# Patient Record
Sex: Male | Born: 1970
Health system: Southern US, Community
[De-identification: ages and names within clinical notes are randomized; demographics above are authoritative.]

## PROBLEM LIST (undated history)

## (undated) DIAGNOSIS — I10 Essential (primary) hypertension: Secondary | ICD-10-CM

## (undated) DIAGNOSIS — G4733 Obstructive sleep apnea (adult) (pediatric): Principal | ICD-10-CM

## (undated) DIAGNOSIS — E785 Hyperlipidemia, unspecified: Secondary | ICD-10-CM

## (undated) DIAGNOSIS — F329 Major depressive disorder, single episode, unspecified: Secondary | ICD-10-CM

## (undated) DIAGNOSIS — F32A Depression, unspecified: Secondary | ICD-10-CM

## (undated) DIAGNOSIS — E0590 Thyrotoxicosis, unspecified without thyrotoxic crisis or storm: Secondary | ICD-10-CM

## (undated) DIAGNOSIS — K219 Gastro-esophageal reflux disease without esophagitis: Secondary | ICD-10-CM

## (undated) HISTORY — DX: Depression, unspecified: F32.A

## (undated) HISTORY — DX: Obstructive sleep apnea (adult) (pediatric): G47.33

## (undated) HISTORY — DX: Essential (primary) hypertension: I10

## (undated) HISTORY — DX: Gastro-esophageal reflux disease without esophagitis: K21.9

## (undated) HISTORY — DX: Major depressive disorder, single episode, unspecified: F32.9

## (undated) HISTORY — DX: Thyrotoxicosis, unspecified without thyrotoxic crisis or storm: E05.90

## (undated) HISTORY — DX: Hyperlipidemia, unspecified: E78.5

---

## 1994-03-21 HISTORY — PX: WISDOM TOOTH EXTRACTION: SHX21

## 2000-03-31 ENCOUNTER — Ambulatory Visit (HOSPITAL_COMMUNITY): Admission: RE | Admit: 2000-03-31 | Discharge: 2000-03-31 | Payer: Self-pay | Admitting: Gastroenterology

## 2000-03-31 ENCOUNTER — Encounter (INDEPENDENT_AMBULATORY_CARE_PROVIDER_SITE_OTHER): Payer: Self-pay | Admitting: Specialist

## 2008-03-21 HISTORY — PX: FINGER SURGERY: SHX640

## 2008-07-09 ENCOUNTER — Ambulatory Visit: Payer: Self-pay | Admitting: Occupational Medicine

## 2008-07-09 ENCOUNTER — Observation Stay (HOSPITAL_COMMUNITY): Admission: EM | Admit: 2008-07-09 | Discharge: 2008-07-10 | Payer: Self-pay | Admitting: Emergency Medicine

## 2008-07-09 DIAGNOSIS — IMO0002 Reserved for concepts with insufficient information to code with codable children: Secondary | ICD-10-CM | POA: Insufficient documentation

## 2009-02-14 ENCOUNTER — Ambulatory Visit: Payer: Self-pay | Admitting: Family Medicine

## 2009-02-14 DIAGNOSIS — J029 Acute pharyngitis, unspecified: Secondary | ICD-10-CM | POA: Insufficient documentation

## 2009-02-14 DIAGNOSIS — E039 Hypothyroidism, unspecified: Secondary | ICD-10-CM | POA: Insufficient documentation

## 2009-02-14 DIAGNOSIS — F329 Major depressive disorder, single episode, unspecified: Secondary | ICD-10-CM | POA: Insufficient documentation

## 2009-02-14 DIAGNOSIS — E785 Hyperlipidemia, unspecified: Secondary | ICD-10-CM | POA: Insufficient documentation

## 2009-02-14 DIAGNOSIS — K219 Gastro-esophageal reflux disease without esophagitis: Secondary | ICD-10-CM | POA: Insufficient documentation

## 2009-02-14 LAB — CONVERTED CEMR LAB: Rapid Strep: NEGATIVE

## 2010-08-03 NOTE — Op Note (Signed)
NAME:  Shane Blackburn, Shane Blackburn NO.:  [id number]   MEDICAL RECORD NO.:  [id number]          PATIENT TYPE:  OBV   LOCATION:  5011                         FACILITY:  [facility]   PHYSICIAN:  Madelynn Done, MD  DATE OF BIRTH:  [birth year]   DATE OF PROCEDURE:  07/09/2008  DATE OF DISCHARGE:                               OPERATIVE REPORT   PREOPERATIVE DIAGNOSES:  1. Left long finger distal tip amputation.  2. Left ring finger nail plate avulsion.   POSTOPERATIVE DIAGNOSES:  1. Left long finger distal tip amputation.  2. Left ring finger nail plate avulsion.   ATTENDING SURGEON:  Madelynn Done, MD, who scrubbed and present for  the entire procedure.   ASSISTANT SURGEON:  None.   SURGICAL PROCEDURES:  1. Left long finger completion amputation through the level of distal      phalanx with local neurectomies and advancement flap closure.  2. Debridement of skin, subcutaneous tissue, and nail plate, left ring      finger.   ANESTHESIA:  General via LMA.   TOURNIQUET TIME:  30 minutes at 250 mmHg.   SURGICAL INDICATIONS:  Mr. Amirault is a 40 year old gentleman who got  his left long finger caught in a fan belt.  He separated off the distal  tip of the finger.  The patient presented to an outside urgent care abd  was transferred to [facility] for definitive treatment.  The patient was  outlined the treatment options and signed informed consent was obtained  to proceed with the above procedure.  Risks, benefits, and alternatives  were discussed in detail.  Risks include but not limited to bleeding,  infection, damage to nearby nerves, arteries, and tendons, loss of  motion in the digits, pain of the stump of the digit, and need for  further surgical intervention.   DESCRIPTION OF PROCEDURE:  The patient was properly identified in the  preoperative holding area.  A marker with a permanent marker was made on  the left long and ring fingers to indicate the correct  operative site.  The patient was then brought back to the operating room and placed  supine on the anesthesia room table where general anesthesia was  administered.  The patient tolerated this well.  A well-padded  tourniquet was then placed on the left forearm and sealed with a 1000  drape.  The left upper extremity was then prepped with Hibiclens and  then sterilely draped.  A time-out was called.  The correct side was  identified and procedure was then begun.  The patient did have an open  distal phalanx fracture to the left long finger with exposed bone and  nail bed and had volar soft tissue loss.  There was moderate amount of  the contamination in the soft tissues.  Following this, the bone was  then taken back in order to obtain for wound closure.  The surrounding  nailbed what left was it most of the nailbed had been avulsed was then  sharply removed, so a complete nail bed ablation was then completed.  There was little  nailbed remaining following the injury.  Following  this, the rongeur was then used to trim up the edges of the bone in  order to obtain for wound closure, making sure to protect the FDP  insertion.  Local neurectomies were then done.  Skin flaps were then  advanced both radially and ulnarly over the top of the distal phalanx.  This allowed for good wound closure.  The wound was then thoroughly  irrigated.  Skin flaps were then advanced and then closed with 4-0 nylon  simple sutures.  There was a good closure with a mild degree of  shortening of the digit through the amputation site through the distal  phalanx.  After completion of amputation, neurectomies, and advancement  flap closure, the wound was then irrigated.  Attention was then turned  to the ring finger where the remaining portion of the nail plate was  then removed and debridement of the skin and subcutaneous tissues as  well as nailbed was then carried out.  The Adaptic dressings were then  applied over  both digits.  Tourniquet was deflated.  Good perfusion of  the fingertips.  A 0.25% Marcaine, 15 mL was then infiltrated between  both digits.  Sterile compressive dressing was then applied.  The  patient was placed in bulky finger dressings.  He was extubated and  taken to the recovery room in good condition.   POSTOPERATIVE PLAN:  The patient will be admitted overnight for IV  antibiotics and pain control, discharge in the morning, then will be  seen back in the office in 7 days for wound check and sutures out likely  at the 2-week mark.      Madelynn Done, MD  Electronically Signed     FWO/MEDQ  D:  07/10/2008  T:  07/10/2008  Job:  [id number]

## 2010-08-06 NOTE — Procedures (Signed)
[facility]  Patient:    Shane Blackburn, Shane Blackburn                       MRN: [id number] Proc. Date: 03/31/00 Attending:  Verlin Grills, M.D. CC:         Vikki Ports, M.D.   Procedure Report  PROCEDURE:  Colonoscopy and biopsy.  ENDOSCOPIST:  Verlin Grills, M.D.  REFERRING PHYSICIAN:  Vikki Ports, M.D.  INDICATIONS FOR PROCEDURE:  The patient (date of birth, Feb 18, 1971) is a 40 year old male.  He was evaluated by Dr. Lanell Persons, January 13, 2000. The patient was experiencing intermittent postprandial abdominal cramps with nonbloody diarrhea.  He denied dysphagia, odynophagia, nausea, vomiting, or gastrointestinal bleeding.  The patient submitted three stools for Hemoccult testing; one of three stool Hemoccult cards was positive for blood.  The patient denies a personal or family history of colorectal cancer.  He takes Synthroid on a daily basis when he developed hyperthyroidism.  He was treated with radioactive iodine resulting in hypothyroidism.  The patient viewed our colonoscopy education film.  He has signed the operative permit for a diagnostic colonoscopy.  MEDICATION ALLERGIES:  None.  CHRONIC MEDICATIONS:  Synthroid for hypothyroidism.  FAMILY HISTORY:  Father is 15 and has hypertension.  Mother is 57 and has hypertension.  His 80 year old sister is in good health.  SOCIAL HISTORY:  The patients 35 year old wife is in excellent health.  His 50-year-old son and 37-year-old son are in excellent health.  PREMEDICATION:  Demerol 50 mg and Versed 10 mg.  ENDOSCOPE:  Olympus pediatric colonoscope.  DESCRIPTION OF PROCEDURE:  After obtaining informed consent, the patient was placed in the left lateral decubitus position.  I administered intravenous Demerol and intravenous Versed to achieve conscious sedation for the procedure.  The patients blood pressure, oxygen saturation, and cardiac rhythm were monitored  throughout the procedure and documented in the medical record.  Anal inspection was normal.  Digital rectal exam was normal.  The Olympus pediatric video colonoscope was introduced into the rectum and under direct vision advanced to the cecum and was identified by a normal-appearing ileocecal valve.  The ileocecal valve was intubated and the distal ileum inspected.  Colonic preparation for the exam today was excellent.  Rectum:  From the distal rectum, a 0.5 mm polyp was removed with the cold biopsy forceps and submitted for pathological interpretation.  Sigmoid colon and descending colon normal.  Splenic flexure normal.  Transverse colon normal.  Hepatic flexure normal.  Ascending colon normal.  Cecum and ileocecal valve normal.  Distal ileum normal.  Three biopsies were taken from the right colon and three biopsies were taken from the descending colon to rule out microscopic - collagenous colitis.  ASSESSMENT: 1. A 0.5 mm polyp that was removed with the cold biopsy forceps in the distal    rectum and submitted for pathological interpretation. 2. Otherwise normal proctocolonoscopy to the cecum with intubation of the    ileocecal valve and distal ileum on inspection. 3. Random biopsies taken from the right colon and left colon to rule out    lymphocytic - collagenous colitis. DD:  03/31/00 TD:  04/01/00 Job: [id number]

## 2014-02-27 ENCOUNTER — Ambulatory Visit (INDEPENDENT_AMBULATORY_CARE_PROVIDER_SITE_OTHER): Payer: BC Managed Care – PPO | Admitting: Cardiology

## 2014-02-27 ENCOUNTER — Encounter: Payer: Self-pay | Admitting: Cardiology

## 2014-02-27 VITALS — BP 136/90 | HR 78 | Ht 69.5 in | Wt 193.1 lb

## 2014-02-27 DIAGNOSIS — G4733 Obstructive sleep apnea (adult) (pediatric): Secondary | ICD-10-CM

## 2014-02-27 NOTE — Patient Instructions (Signed)
Your physician has recommended that you have a sleep study (NOT split night, regular PSG). This test records several body functions during sleep, including: brain activity, eye movement, oxygen and carbon dioxide blood levels, heart rate and rhythm, breathing rate and rhythm, the flow of air through your mouth and nose, snoring, body muscle movements, and chest and belly movement.  Your physician wants you to follow-up in: 1 year with Dr. Mayford Knifeurner. You will receive a reminder letter in the mail two months in advance. If you don't receive a letter, please call our office to schedule the follow-up appointment.

## 2014-02-27 NOTE — Progress Notes (Signed)
  [address] [place], [place]  [address] Phone: [phone] Fax:  [phone]  Date:  02/27/2014   ID:  Shane PilotColin Blackburn, DOB [birth year], MRN [id number]  PCP:  No primary care provider on file.  Cardiologist:  Shane Magicraci Grady Mohabir, MD    History of Present Illness: This is Shane 43yo male with Shane history of OSA who was intolerant to CPAP and has been using an oral device and HTN who presents today for followup of his HTN.  He says that he has been snoring some at home despite using the oral device.  He denies any chest pain, SOB, DOE, LE edema,  palpitations or syncope.  Occasionally he will have some dizziness when going from sitting to standing.  He has had some problems with sleepiness after eating lunch in the afternoon.    Wt Readings from Last 3 Encounters:  02/27/14 193 lb 1.9 oz (87.599 kg)     Past Medical History  Diagnosis Date  . Hyperthyroidism     now hypothyroidism  . GERD (gastroesophageal reflux disease)   . Depression   . Dyslipidemia   . Hypertension     Current Outpatient Prescriptions  Medication Sig Dispense Refill  . atorvastatin (LIPITOR) 40 MG tablet Take 40 mg by mouth daily.    Marland Kitchen. levothyroxine (SYNTHROID, LEVOTHROID) 137 MCG tablet Take 137 mcg by mouth daily before breakfast.    . losartan (COZAAR) 50 MG tablet Take 50 mg by mouth daily.    Marland Kitchen. omeprazole (PRILOSEC) 20 MG capsule Take 20 mg by mouth daily.     No current facility-administered medications for this visit.    Allergies:    Allergies  Allergen Reactions  . Norvasc [Amlodipine Besylate]     swelling    Social History:  The patient  reports that he has never smoked. He does not have any smokeless tobacco history on file.   Family History:  The patient's family history includes CVA in his father; Diabetes in his mother; Hypertension in his father and mother.   ROS:  Please see the history of present illness.      All other systems reviewed and negative.   PHYSICAL EXAM: VS:  BP  136/90 mmHg  Pulse 78  Ht 5' 9.5" (1.765 m)  Wt 193 lb 1.9 oz (87.599 kg)  BMI 28.12 kg/m2 Well nourished, well developed, in no acute distress HEENT: normal Neck: no JVD Cardiac:  normal S1, S2; RRR; no murmur Lungs:  clear to auscultation bilaterally, no wheezing, rhonchi or rales Abd: soft, nontender, no hepatomegaly Ext: no edema Skin: warm and dry Neuro:  CNs 2-12 intact, no focal abnormalities noted   ASSESSMENT AND PLAN:  1. OSA intolerant to CPAP and doing well with oral device but he has been having more problems with snoring especially when sleeping on his back. He does get sleepy in the afternoon after lunch.   I will repeat Shane PSG with the oral device to see if his OSA is adequately treated.  2. HTN borderline controlled.  He will continue on Losartan.  He will check his BP next week at work and call with results  Followup with me in 1 year  Signed, Shane Magicraci Lorelle Macaluso, MD [facility] 02/27/2014 11:16 AM

## 2014-03-10 ENCOUNTER — Telehealth: Payer: Self-pay | Admitting: Cardiology

## 2014-03-10 NOTE — Telephone Encounter (Signed)
New message     Bp readings Dec 14th   133/81, 16th  135/85;  18th 136/80

## 2014-03-10 NOTE — Telephone Encounter (Signed)
To Dr. Turner for review. 

## 2014-05-18 ENCOUNTER — Ambulatory Visit (HOSPITAL_BASED_OUTPATIENT_CLINIC_OR_DEPARTMENT_OTHER): Payer: BC Managed Care – PPO | Attending: Cardiology

## 2014-05-18 DIAGNOSIS — G4733 Obstructive sleep apnea (adult) (pediatric): Secondary | ICD-10-CM

## 2014-05-26 ENCOUNTER — Telehealth: Payer: Self-pay | Admitting: Cardiology

## 2014-05-26 NOTE — Sleep Study (Signed)
   NAME: Shane PilotColin Pillay DATE OF BIRTH:  [birth year] MEDICAL RECORD NUMBER [id number]  LOCATION: [facility]  PHYSICIAN: Bhavya Eschete R  DATE OF STUDY: 05/18/2014  SLEEP STUDY TYPE: Nocturnal Polysomnogram               REFERRING PHYSICIAN: Quintella Reicherturner, Brennen Gardiner R, MD  INDICATION FOR STUDY: Obstructive sleep apnea currently using an oral device but now having more snoring.  Study done with patient wearing his oral device.    EPWORTH SLEEPINESS SCORE: 2 HEIGHT: 5\' 9"  (175.3 cm)  WEIGHT: 192 lb (87.091 kg)    Body mass index is 28.34 kg/(m^2).  NECK SIZE: 17 in.  MEDICATIONS: Reviewed in the chart  SLEEP ARCHITECTURE: The patient slept for a total of 323 minutes out of a total sleep period of 379 minutes.  There was 1.5 minutes of slow wave sleep and 46 minutes of REM sleep.  The onset to sleep latency was 5 minutes and onset to REM sleep latency was short at 61 minutes.  The sleep efficiency was normal at 85%.  There was mild snoring.  There were increased arousals which were primarily spontaneous arousals.  RESPIRATORY DATA: The patient had 2 central apneas and 14 hypopneas noted during sleep.  Most occurred during REM sleep in the supine position.  The overall AHI was normal at 3 events per hour.   OXYGEN DATA: The lowest oxygen saturation noted was 90%.    CARDIAC DATA: The patient maintained NSR with sinus arrhythmia and PAC's.  The average heart rate during sleep was 68 bpm.  The lowest heart rate was 35 bpm and the highest heart rate was 187 bpm.    MOVEMENT/PARASOMNIA: There were no periodic limb movement disorders or REM sleep behavior disorders noted.    IMPRESSION/ RECOMMENDATION:   1.  No significant sleep apnea/hypopnea syndrome during sleep while wearing oral device. 2.  No significant oxygen desaturations were noted. 3.  There were occasional PVC's noted. NSR was present for study with average heart rate of 68 bpm. 4.  Mild snoring was noted. 5.  No  periodic limb movements were noted. 6.  Normal sleep efficiency overall with increased number of spontaneous arousals from sleep. 7.  Could consider ENT referral to evaluate for surgical causes for continued snoring. 8.  The patient should be counseled on good sleep hygiene. 9.  The patient should be counseled on avoiding sleeping in the supine position.   Signed: Quintella ReichertURNER,Kasch Borquez R Diplomate, American Board of Sleep Medicine  ELECTRONICALLY SIGNED ON:  05/26/2014, 10:24 AM [facility] PH: [phone]   FX: [phone] ACCREDITED BY THE AMERICAN ACADEMY OF SLEEP MEDICINE

## 2014-05-26 NOTE — Telephone Encounter (Signed)
Please let patient know that his sleep apnea is very well treated with the oral device so no changes need to be made.  He does snore some.  I would recommend that he avoid sleeping supine.  Please find out if patient would like referral to ENT to evaluate for surgical causes of continued snoring.  I would recommend that he use nasal saline spray BID to help reduce any inflammation in nasal passages.

## 2014-05-27 NOTE — Telephone Encounter (Signed)
Patient is aware that sleep apnea is well treated so there will be no changes made. I also let him know to try to avoid sleeping on his back, and that Dr. Mayford Knifeurner said she would recommend the nasal saline spray twice a day for inflammation. He stated at this time he is not interested in a referral to the ENT, but if he changes his mind he will let us know.

## 2014-05-27 NOTE — Telephone Encounter (Signed)
Left message to call concerning sleep study

## 2014-11-04 ENCOUNTER — Telehealth: Payer: Self-pay | Admitting: Cardiology

## 2014-11-04 NOTE — Telephone Encounter (Signed)
Walk in pt form-Dept of Transportation-paper dropped off Katy back Monday (8/22) will give to her then.

## 2014-12-03 ENCOUNTER — Encounter: Payer: Self-pay | Admitting: Cardiology

## 2015-01-21 ENCOUNTER — Encounter: Payer: Self-pay | Admitting: Cardiology

## 2015-03-03 ENCOUNTER — Encounter: Payer: Self-pay | Admitting: Cardiology

## 2015-03-03 ENCOUNTER — Ambulatory Visit (INDEPENDENT_AMBULATORY_CARE_PROVIDER_SITE_OTHER): Payer: BLUE CROSS/BLUE SHIELD | Admitting: Cardiology

## 2015-03-03 VITALS — BP 122/84 | HR 60 | Ht 69.0 in | Wt 156.0 lb

## 2015-03-03 DIAGNOSIS — G4733 Obstructive sleep apnea (adult) (pediatric): Secondary | ICD-10-CM

## 2015-03-03 DIAGNOSIS — I10 Essential (primary) hypertension: Secondary | ICD-10-CM | POA: Diagnosis not present

## 2015-03-03 HISTORY — DX: Obstructive sleep apnea (adult) (pediatric): G47.33

## 2015-03-03 HISTORY — DX: Essential (primary) hypertension: I10

## 2015-03-03 NOTE — Progress Notes (Signed)
Cardiology Office Note   Date:  03/03/2015   ID:  Shane Blackburn, DOB [birth year], MRN [id number]  PCP:  No primary care provider on file.    Chief Complaint  Patient presents with  . Follow-up    OSA      History of Present Illness: This is a 44yo male with a history of OSA who was intolerant to CPAP and has been using an oral device and HTN who presents today for followup of his HTN. He says that he has minimal snoring when using the oral device. He denies any chest pain, SOB, DOE, LE edema, palpitations or syncope.He feels rested in the am and has no daytime sleepiness.  He has lost 42 lbs since I saw him last. He would like to have another sleep study to see if he still needs to wear the oral device since losing weight.  Past Medical History  Diagnosis Date  . Hyperthyroidism     now hypothyroidism  . GERD (gastroesophageal reflux disease)   . Depression   . Dyslipidemia   . Hypertension   . OSA (obstructive sleep apnea) 03/03/2015  . Benign essential HTN 03/03/2015    Past Surgical History  Procedure Laterality Date  . Finger surgery  2010    finger tip  . Wisdom tooth extraction  1996     Current Outpatient Prescriptions  Medication Sig Dispense Refill  . levothyroxine (SYNTHROID, LEVOTHROID) 137 MCG tablet Take 137 mcg by mouth daily before breakfast.     No current facility-administered medications for this visit.    Allergies:   Norvasc    Social History:  The patient  reports that he has never smoked. He does not have any smokeless tobacco history on file.   Family History:  The patient's family history includes CVA in his father; Diabetes in his mother; Hypertension in his father and mother.    ROS:  Please see the history of present illness.   Otherwise, review of systems are positive for none.   All other systems are reviewed and negative.    PHYSICAL EXAM: VS:  BP 122/84 mmHg  Pulse 60  Ht 5\' 9"  (1.753 m)  Wt 156 lb  (70.761 kg)  BMI 23.03 kg/m2  SpO2 98% , BMI Body mass index is 23.03 kg/(m^2). GEN: Well nourished, well developed, in no acute distress HEENT: normal Neck: no JVD, carotid bruits, or masses Cardiac: RRR; no murmurs, rubs, or gallops,no edema  Respiratory:  clear to auscultation bilaterally, normal work of breathing GI: soft, nontender, nondistended, + BS MS: no deformity or atrophy Skin: warm and dry, no rash Neuro:  Strength and sensation are intact Psych: euthymic mood, full affect   EKG:  EKG is not ordered today.   Recent Labs: No results found for requested labs within last 365 days.    Lipid Panel No results found for: CHOL, TRIG, HDL, CHOLHDL, VLDL, LDLCALC, LDLDIRECT    Wt Readings from Last 3 Encounters:  03/03/15 156 lb (70.761 kg)  05/18/14 192 lb (87.091 kg)  02/27/14 193 lb 1.9 oz (87.599 kg)     ASSESSMENT AND PLAN:  1. OSA intolerant to CPAP and doing well with oral device with last PSG with the oral device with no evidence of OSA.  He has lost 42 lbs and would like to see if he still needs the oral device.  I will order a home sleep  study and have instructed him not to wear his device the nights he does the study.  If no significant OSA then can stop the oral device. 2. HTN controlled.    Current medicines are reviewed at length with the patient today.  The patient does not have concerns regarding medicines.  The following changes have been made:  no change  Labs/ tests ordered today: See above Assessment and Plan No orders of the defined types were placed in this encounter.     Disposition:   FU with me in 1 year  Signed, Quintella Reichert, MD  03/03/2015 8:57 AM    [facility] [address] [place], [place], [place]  [address] Phone: [phone]; Fax: [phone]

## 2015-03-03 NOTE — Patient Instructions (Signed)
Medication Instructions:  Your physician recommends that you continue on your current medications as directed. Please refer to the Current Medication list given to you today.   Labwork: None  Testing/Procedures: Dr. Mayford Knifeurner recommends you have a HOME SLEEP STUDY. Our office CPAP assistant will be in touch with you soon to get this scheduled. Her name is Toma CopierBethany, [place], and her direct line is [phone].  Follow-Up: Your physician wants you to follow-up in: 1 year with Dr. Mayford Knifeurner. You will receive a reminder letter in the mail two months in advance. If you don't receive a letter, please call our office to schedule the follow-up appointment.   Any Other Special Instructions Will Be Listed Below (If Applicable).     If you need a refill on your cardiac medications before your next appointment, please call your pharmacy.

## 2015-04-08 ENCOUNTER — Telehealth: Payer: Self-pay | Admitting: Cardiology

## 2015-04-08 DIAGNOSIS — G4733 Obstructive sleep apnea (adult) (pediatric): Secondary | ICD-10-CM

## 2015-04-08 NOTE — Telephone Encounter (Signed)
Left message for patient that Toma Copier will call tomorrow to initiate scheduling process for sleep study.

## 2015-04-08 NOTE — Telephone Encounter (Signed)
New Message  Pt calling for rn to schedule home study for sleep

## 2015-04-09 NOTE — Telephone Encounter (Signed)
OK to proceed with in lab sleep study

## 2015-04-09 NOTE — Addendum Note (Signed)
Addended by: Arcola Jansky on: 04/09/2015 10:45 AM   Modules accepted: Orders

## 2015-04-09 NOTE — Telephone Encounter (Signed)
Patient does still have [employer] blue shield, so he does not qualify for a home sleep study because his BMI is not 35 or greater.   Patient is qualified for a lab sleep study, and says he will do that if he needs to.  I will route to Dr. Mayford Knife for approval, then I will order a lab study and get him scheduled.  Patient is aware that I will call him back with more information.

## 2015-04-09 NOTE — Telephone Encounter (Signed)
I have left a message for patient to call me to verify his insurance. If he is still with [employer], they will not cover a home sleep study for him because he does not meet all the requirements.  Once I know for sure if he has the same insurance, I will route to Turner to discuss.

## 2015-04-14 ENCOUNTER — Other Ambulatory Visit: Payer: Self-pay | Admitting: *Deleted

## 2015-04-14 DIAGNOSIS — G4733 Obstructive sleep apnea (adult) (pediatric): Secondary | ICD-10-CM

## 2015-05-27 ENCOUNTER — Ambulatory Visit (HOSPITAL_BASED_OUTPATIENT_CLINIC_OR_DEPARTMENT_OTHER): Payer: BLUE CROSS/BLUE SHIELD | Attending: Cardiology | Admitting: *Deleted

## 2015-05-27 DIAGNOSIS — G4733 Obstructive sleep apnea (adult) (pediatric): Secondary | ICD-10-CM | POA: Insufficient documentation

## 2015-05-31 ENCOUNTER — Telehealth: Payer: Self-pay | Admitting: Cardiology

## 2015-05-31 NOTE — Sleep Study (Signed)
   Patient Name: Shane Blackburn, Shane Blackburn MRN: [id number] Study Date: 05/27/2015 Gender: Male D.O.B: 08/08/1970 Age (years): [age] Referring Provider: Armanda Magicraci Turner MD, ABSM Interpreting Physician: Armanda Magicraci Turner MD, ABSM RPSGT: Neeriemer, Holly  Weight (lbs): 151 Height (inches): 69 BMI: 22 Neck Size: 15.25  CLINICAL INFORMATION Sleep Study Type: NPSG Indication for sleep study: OSA Epworth Sleepiness Score: 6  SLEEP STUDY TECHNIQUE As per the AASM Manual for the Scoring of Sleep and Associated Events v2.3 (April 2016) with a hypopnea requiring 4% desaturations. The channels recorded and monitored were frontal, central and occipital EEG, electrooculogram (EOG), submentalis EMG (chin), nasal and oral airflow, thoracic and abdominal wall motion, anterior tibialis EMG, snore microphone, electrocardiogram, and pulse oximetry.  MEDICATIONS Patient's medications include: Synthroid.  Medications self-administered by patient during sleep study : No sleep medicine administered.  SLEEP ARCHITECTURE The study was initiated at 10:02:44 PM and ended at 4:37:11 AM. Sleep onset time was 20.9 minutes and the sleep efficiency was reduced 74.0%. The total sleep time was 292.0 minutes. Stage REM latency was 104.5 minutes. The patient spent 7.53% of the night in stage N1 sleep, 81.34% in stage N2 sleep, 5.65% in stage N3 and 5.48% in REM. Alpha intrusion was absent. Supine sleep was 67.22%.  RESPIRATORY PARAMETERS The overall apnea/hypopnea index (AHI) was 0.2 per hour. There were 1 total apneas, including 0 obstructive, 1 central and 0 mixed apneas. There were 0 hypopneas and 1 RERAs. The AHI during Stage REM sleep was 0.0 per hour. AHI while supine was 0.3 per hour. The mean oxygen saturation was 95.61%. The minimum SpO2 during sleep was 93.00%. snoring was noted during this study.  CARDIAC DATA The 2 lead EKG demonstrated sinus rhythm. The mean heart rate was 61.69 beats per minute. Other EKG  findings include: None.  LEG MOVEMENT DATA The total PLMS were 1 with a resulting PLMS index of 0.21. Associated arousal with leg movement index was 0.0 .  IMPRESSIONS - No significant obstructive sleep apnea occurred during this study (AHI = 0.2/h). - No significant central sleep apnea occurred during this study (CAI = 0.2/h). - The patient had minimal or no oxygen desaturation during the study (Min O2 = 93.00%) - No snoring was audible during this study. - No cardiac abnormalities were noted during this study. - Clinically significant periodic limb movements did not occur during sleep. No significant associated arousals.  RECOMMENDATIONS - Avoid alcohol, sedatives and other CNS depressants that may worsen sleep apnea and disrupt normal sleep architecture. - Sleep hygiene should be reviewed to assess factors that may improve sleep quality. - Weight management and regular exercise should be initiated or continued if appropriate.  Quintella ReichertURNER,TRACI R Diplomate, American Board of Sleep Medicine  ELECTRONICALLY SIGNED ON:  05/31/2015, 5:54 PM [facility] PH: [phone]   FX: [phone] ACCREDITED BY THE AMERICAN ACADEMY OF SLEEP MEDICINE

## 2015-05-31 NOTE — Telephone Encounter (Signed)
Please let patient know that sleep study showed no significant sleep apnea.    

## 2015-06-01 NOTE — Telephone Encounter (Signed)
Shane Blackburn recentll had a sleep study which showed that he no longer has Obstructive Sleep Apnea and no longer needs to use his oral device at night.

## 2015-06-01 NOTE — Telephone Encounter (Signed)
Patient has been informed of information.  Stated verbal understanding.  Patient is wanting to see if there is something he can get from Dr. Mayford Knifeurner stating that he is now without apnea and no longer needs his his oral device.

## 2015-06-01 NOTE — Telephone Encounter (Signed)
Left message for patient to call back  

## 2015-06-02 NOTE — Telephone Encounter (Signed)
Information has been mailed to patient per his request.

## 2015-06-09 ENCOUNTER — Ambulatory Visit (HOSPITAL_BASED_OUTPATIENT_CLINIC_OR_DEPARTMENT_OTHER): Payer: BLUE CROSS/BLUE SHIELD

## 2015-10-09 DIAGNOSIS — N3943 Post-void dribbling: Secondary | ICD-10-CM | POA: Diagnosis not present

## 2016-04-28 DIAGNOSIS — E890 Postprocedural hypothyroidism: Secondary | ICD-10-CM | POA: Diagnosis not present

## 2016-04-28 DIAGNOSIS — Z0000 Encounter for general adult medical examination without abnormal findings: Secondary | ICD-10-CM | POA: Diagnosis not present

## 2016-05-03 DIAGNOSIS — M545 Low back pain: Secondary | ICD-10-CM | POA: Diagnosis not present

## 2016-05-03 DIAGNOSIS — M25559 Pain in unspecified hip: Secondary | ICD-10-CM | POA: Diagnosis not present

## 2016-05-03 DIAGNOSIS — M5432 Sciatica, left side: Secondary | ICD-10-CM | POA: Diagnosis not present

## 2016-05-03 DIAGNOSIS — M533 Sacrococcygeal disorders, not elsewhere classified: Secondary | ICD-10-CM | POA: Diagnosis not present

## 2016-05-04 DIAGNOSIS — Z23 Encounter for immunization: Secondary | ICD-10-CM | POA: Diagnosis not present

## 2016-05-04 DIAGNOSIS — Z0000 Encounter for general adult medical examination without abnormal findings: Secondary | ICD-10-CM | POA: Diagnosis not present

## 2016-05-06 DIAGNOSIS — M533 Sacrococcygeal disorders, not elsewhere classified: Secondary | ICD-10-CM | POA: Diagnosis not present

## 2016-05-06 DIAGNOSIS — M545 Low back pain: Secondary | ICD-10-CM | POA: Diagnosis not present

## 2016-05-06 DIAGNOSIS — M25559 Pain in unspecified hip: Secondary | ICD-10-CM | POA: Diagnosis not present

## 2016-05-10 DIAGNOSIS — M533 Sacrococcygeal disorders, not elsewhere classified: Secondary | ICD-10-CM | POA: Diagnosis not present

## 2016-05-10 DIAGNOSIS — M545 Low back pain: Secondary | ICD-10-CM | POA: Diagnosis not present

## 2016-05-10 DIAGNOSIS — M25559 Pain in unspecified hip: Secondary | ICD-10-CM | POA: Diagnosis not present

## 2016-05-13 DIAGNOSIS — M25559 Pain in unspecified hip: Secondary | ICD-10-CM | POA: Diagnosis not present

## 2016-05-13 DIAGNOSIS — M545 Low back pain: Secondary | ICD-10-CM | POA: Diagnosis not present

## 2016-05-13 DIAGNOSIS — M533 Sacrococcygeal disorders, not elsewhere classified: Secondary | ICD-10-CM | POA: Diagnosis not present

## 2016-05-18 DIAGNOSIS — M533 Sacrococcygeal disorders, not elsewhere classified: Secondary | ICD-10-CM | POA: Diagnosis not present

## 2016-05-18 DIAGNOSIS — M545 Low back pain: Secondary | ICD-10-CM | POA: Diagnosis not present

## 2016-05-18 DIAGNOSIS — M25559 Pain in unspecified hip: Secondary | ICD-10-CM | POA: Diagnosis not present

## 2016-10-24 DIAGNOSIS — M545 Low back pain: Secondary | ICD-10-CM | POA: Diagnosis not present

## 2016-10-24 DIAGNOSIS — M533 Sacrococcygeal disorders, not elsewhere classified: Secondary | ICD-10-CM | POA: Diagnosis not present

## 2016-10-24 DIAGNOSIS — M5432 Sciatica, left side: Secondary | ICD-10-CM | POA: Diagnosis not present

## 2016-10-24 DIAGNOSIS — M25559 Pain in unspecified hip: Secondary | ICD-10-CM | POA: Diagnosis not present

## 2016-10-25 DIAGNOSIS — M533 Sacrococcygeal disorders, not elsewhere classified: Secondary | ICD-10-CM | POA: Diagnosis not present

## 2016-10-25 DIAGNOSIS — M25559 Pain in unspecified hip: Secondary | ICD-10-CM | POA: Diagnosis not present

## 2016-10-25 DIAGNOSIS — M545 Low back pain: Secondary | ICD-10-CM | POA: Diagnosis not present

## 2016-10-28 DIAGNOSIS — M25559 Pain in unspecified hip: Secondary | ICD-10-CM | POA: Diagnosis not present

## 2016-10-28 DIAGNOSIS — M545 Low back pain: Secondary | ICD-10-CM | POA: Diagnosis not present

## 2016-10-28 DIAGNOSIS — M533 Sacrococcygeal disorders, not elsewhere classified: Secondary | ICD-10-CM | POA: Diagnosis not present

## 2016-12-21 ENCOUNTER — Emergency Department
Admission: EM | Admit: 2016-12-21 | Discharge: 2016-12-21 | Disposition: A | Discharge status: Home / Self Care | Payer: BLUE CROSS/BLUE SHIELD | Source: Home / Self Care | Attending: Emergency Medicine | Admitting: Emergency Medicine

## 2016-12-21 ENCOUNTER — Encounter: Payer: Self-pay | Admitting: *Deleted

## 2016-12-21 DIAGNOSIS — S56521A Laceration of other extensor muscle, fascia and tendon at forearm level, right arm, initial encounter: Secondary | ICD-10-CM

## 2016-12-21 DIAGNOSIS — S51811A Laceration without foreign body of right forearm, initial encounter: Secondary | ICD-10-CM

## 2016-12-21 DIAGNOSIS — S59911A Unspecified injury of right forearm, initial encounter: Secondary | ICD-10-CM | POA: Diagnosis not present

## 2016-12-21 NOTE — Discharge Instructions (Signed)
Please keep your appointment at 2:00 with Dr. Orland Mustard

## 2016-12-21 NOTE — ED Provider Notes (Signed)
[facility]    CSN: [id number] Arrival date & time: 12/21/16  1141     History   Chief Complaint Chief Complaint  Patient presents with  . Laceration    HPI Shane Blackburn is a 46 y.o. male.  Patient was in his usual state of health until approximately 2 hours ago when while carrying trash a piece of sharp metal lacerated his right arm. He enters here with a laceration ulnar side of the right wrist. He does not complain of any weakness or numbness in the fifth finger. HPI  Past Medical History:  Diagnosis Date  . Benign essential HTN 03/03/2015  . Depression   . Dyslipidemia   . GERD (gastroesophageal reflux disease)   . Hypertension   . Hyperthyroidism    now hypothyroidism  . OSA (obstructive sleep apnea) 03/03/2015    Patient Active Problem List   Diagnosis Date Noted  . OSA (obstructive sleep apnea) 03/03/2015  . Benign essential HTN 03/03/2015  . HYPOTHYROIDISM 02/14/2009  . HYPERLIPIDEMIA 02/14/2009  . DEPRESSION 02/14/2009  . ACUTE PHARYNGITIS 02/14/2009  . GERD 02/14/2009  . 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 FINGER SURGERY  2010   finger tip  . WISDOM TOOTH EXTRACTION  1996       Home Medications    Prior to Admission medications   Medication Sig Start Date End Date Taking? Authorizing Provider  levothyroxine (SYNTHROID, LEVOTHROID) 137 MCG tablet Take 112 mcg by mouth daily before breakfast.     [provider]    Family History Family History  Problem Relation Age of Onset  . Hypertension Mother   . Diabetes Mother   . Hypertension Father   . CVA Father     Social History Social History  Substance Use Topics  . Smoking status: Never Smoker  . Smokeless tobacco: Never Used  . Alcohol use Not on file     Allergies   Norvasc [amlodipine besylate]   Review of Systems Review of Systems  Constitutional: Negative.   Respiratory:  Negative.   Cardiovascular: Negative.   Gastrointestinal: Negative.      Physical Exam Triage Vital Signs ED Triage Vitals  Enc Vitals Group     BP 12/21/16 1158 (!) 145/91     Pulse Rate 12/21/16 1158 77     Resp 12/21/16 1158 16     Temp 12/21/16 1158 (!) 97.5 F (36.4 C)     Temp Source 12/21/16 1158 Oral     SpO2 12/21/16 1158 97 %     Weight --      Height --      Head Circumference --      Peak Flow --      Pain Score 12/21/16 1159 4     Pain Loc --      Pain Edu? --      Excl. in GC? --    No data found.   Updated Vital Signs BP (!) 145/91 (BP Location: Left Arm)   Pulse 77   Temp (!) 97.5 F (36.4 C) (Oral)   Resp 16   SpO2 97%   Visual Acuity Right Eye Distance:   Left Eye Distance:   Bilateral Distance:    Right Eye Near:   Left Eye Near:    Bilateral Near:     Physical Exam  Musculoskeletal: Normal range of motion. He exhibits tenderness. He exhibits no edema or deformity.  Skin:  There is a 2 cm flap-like laceration on the ulnar side of the right forearm 1-1/2 inches proximal to the distal ulnar styloid.with evaluation of the laceration and flexion of the wrist a laceration through the tendon to the extensor carpi ulnar skin is visible. The wound was therefore not approximated.     UC Treatments / Results  Labs (all labs ordered are listed, but only abnormal results are displayed) Labs Reviewed - No data to display  EKG  EKG Interpretation None       Radiology No results found.  Procedures Procedures (including critical care time)  Medications Ordered in UC Medications - No data to display   Initial Impression / Assessment and Plan / UC Course  I have reviewed the triage vital signs and the nursing notes.  Pertinent labs & imaging results that were available during my care of the patient were reviewed by me and considered in my medical decision making (see chart for details). This patient has a flap-like laceration with  laceration through the extensor carpi ulnaris. He has been referred to the hand orthopedist in [facility].Dr. Saul Fordyce      Final Clinical Impressions(s) / UC Diagnoses   Final diagnoses:  Laceration of extensor tendon of right forearm, initial encounter  Laceration of right forearm, initial encounter    New Prescriptions New Prescriptions   No medications on file     Controlled Substance Prescriptions [place] Controlled Substance Registry consulted? Not Applicable   Collene Gobble, MD 12/22/16 [phone]

## 2016-12-21 NOTE — ED Triage Notes (Signed)
Pt c/o RT wrist laceration x 1130 today. He was taking out the trash when trash can fell over and a piece of sheet metal in the trash cut him. Tdap 2014.

## 2016-12-23 ENCOUNTER — Telehealth: Payer: Self-pay | Admitting: *Deleted

## 2016-12-23 NOTE — Telephone Encounter (Signed)
Called to check pt's status LM to call back if he has any questions or concerns.  

## 2017-01-04 DIAGNOSIS — S59911A Unspecified injury of right forearm, initial encounter: Secondary | ICD-10-CM | POA: Diagnosis not present

## 2017-01-13 DIAGNOSIS — R6884 Jaw pain: Secondary | ICD-10-CM | POA: Diagnosis not present

## 2017-03-03 DIAGNOSIS — M545 Low back pain: Secondary | ICD-10-CM | POA: Diagnosis not present

## 2017-03-03 DIAGNOSIS — M533 Sacrococcygeal disorders, not elsewhere classified: Secondary | ICD-10-CM | POA: Diagnosis not present

## 2017-03-03 DIAGNOSIS — M25559 Pain in unspecified hip: Secondary | ICD-10-CM | POA: Diagnosis not present

## 2017-03-05 DIAGNOSIS — R51 Headache: Secondary | ICD-10-CM | POA: Diagnosis not present

## 2017-03-27 DIAGNOSIS — M545 Low back pain: Secondary | ICD-10-CM | POA: Diagnosis not present

## 2017-03-27 DIAGNOSIS — M25559 Pain in unspecified hip: Secondary | ICD-10-CM | POA: Diagnosis not present

## 2017-03-27 DIAGNOSIS — M533 Sacrococcygeal disorders, not elsewhere classified: Secondary | ICD-10-CM | POA: Diagnosis not present

## 2017-03-29 DIAGNOSIS — M533 Sacrococcygeal disorders, not elsewhere classified: Secondary | ICD-10-CM | POA: Diagnosis not present

## 2017-03-29 DIAGNOSIS — M25559 Pain in unspecified hip: Secondary | ICD-10-CM | POA: Diagnosis not present

## 2017-03-29 DIAGNOSIS — M545 Low back pain: Secondary | ICD-10-CM | POA: Diagnosis not present

## 2017-03-31 DIAGNOSIS — M533 Sacrococcygeal disorders, not elsewhere classified: Secondary | ICD-10-CM | POA: Diagnosis not present

## 2017-03-31 DIAGNOSIS — M25559 Pain in unspecified hip: Secondary | ICD-10-CM | POA: Diagnosis not present

## 2017-03-31 DIAGNOSIS — M545 Low back pain: Secondary | ICD-10-CM | POA: Diagnosis not present

## 2017-04-01 ENCOUNTER — Encounter: Payer: Self-pay | Admitting: Emergency Medicine

## 2017-04-01 ENCOUNTER — Other Ambulatory Visit: Payer: Self-pay

## 2017-04-01 ENCOUNTER — Emergency Department (INDEPENDENT_AMBULATORY_CARE_PROVIDER_SITE_OTHER): Payer: BLUE CROSS/BLUE SHIELD

## 2017-04-01 ENCOUNTER — Emergency Department
Admission: EM | Admit: 2017-04-01 | Discharge: 2017-04-01 | Disposition: A | Discharge status: Home / Self Care | Payer: BLUE CROSS/BLUE SHIELD | Source: Home / Self Care | Attending: Family Medicine | Admitting: Family Medicine

## 2017-04-01 DIAGNOSIS — G8929 Other chronic pain: Secondary | ICD-10-CM | POA: Diagnosis not present

## 2017-04-01 DIAGNOSIS — M5441 Lumbago with sciatica, right side: Secondary | ICD-10-CM | POA: Diagnosis not present

## 2017-04-01 DIAGNOSIS — M545 Low back pain, unspecified: Secondary | ICD-10-CM

## 2017-04-01 MED ORDER — CYCLOBENZAPRINE HCL 10 MG PO TABS: 10.0000 mg | ORAL_TABLET | Freq: Two times a day (BID) | ORAL | 0 refills | Status: DC | PRN

## 2017-04-01 MED ORDER — METHYLPREDNISOLONE ACETATE 80 MG/ML IJ SUSP
80.0000 mg | Freq: Once | INTRAMUSCULAR | Status: AC
  Administered 2017-04-01: 80 mg via INTRAMUSCULAR

## 2017-04-01 MED ORDER — PREDNISONE 20 MG PO TABS: ORAL_TABLET | ORAL | 0 refills | Status: DC

## 2017-04-01 MED ORDER — MELOXICAM 15 MG PO TABS: 15.0000 mg | ORAL_TABLET | Freq: Every day | ORAL | 0 refills | Status: DC

## 2017-04-01 MED ORDER — TRAMADOL HCL 50 MG PO TABS: 50.0000 mg | ORAL_TABLET | Freq: Four times a day (QID) | ORAL | 0 refills | Status: DC | PRN

## 2017-04-01 NOTE — ED Provider Notes (Signed)
[facility]    CSN: [id number] Arrival date & time: 04/01/17  0941     History   Chief Complaint Chief Complaint  Patient presents with  . Back Pain    HPI Shane Blackburn is a 47 y.o. male.   HPI  Shane PilotColin Hardgrave is a 47 y.o. male presenting to UC with c/o exacerbation of chronic low back pain.  Pain flared up this morning when attempting to put his shoes on.  He went to his chiropractor who attempted an adjustment w/o success. Pain is aching and sore, sharp with certain movements, 5/10. He was given a back brace and walker, which do help with the pain but pt is concerned as pain is radiating into Right thigh and is never usually this severe. Denies change in bowel or bladder habits. No specific injury he can recall. He took Naproxen at 730AM w/o relief.  Denies hx of back surgeries.    Past Medical History:  Diagnosis Date  . Benign essential HTN 03/03/2015  . Depression   . Dyslipidemia   . GERD (gastroesophageal reflux disease)   . Hypertension   . Hyperthyroidism    now hypothyroidism  . OSA (obstructive sleep apnea) 03/03/2015    Patient Active Problem List   Diagnosis Date Noted  . OSA (obstructive sleep apnea) 03/03/2015  . Benign essential HTN 03/03/2015  . HYPOTHYROIDISM 02/14/2009  . HYPERLIPIDEMIA 02/14/2009  . DEPRESSION 02/14/2009  . ACUTE PHARYNGITIS 02/14/2009  . GERD 02/14/2009  . 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 FINGER SURGERY  2010   finger tip  . WISDOM TOOTH EXTRACTION  1996       Home Medications    Prior to Admission medications   Medication Sig Start Date End Date Taking? Authorizing Provider  cyclobenzaprine (FLEXERIL) 10 MG tablet Take 1 tablet (10 mg total) by mouth 2 (two) times daily as needed. 04/01/17   Lurene ShadowPhelps, Amyria Komar O, PA-C  levothyroxine (SYNTHROID, LEVOTHROID) 137 MCG tablet Take 112 mcg by mouth daily before breakfast.      [provider]  meloxicam (MOBIC) 15 MG tablet Take 1 tablet (15 mg total) by mouth daily. Take once daily for 5 days, then daily as needed for pain 04/01/17   Lurene ShadowPhelps, Heiress Williamson O, PA-C  predniSONE (DELTASONE) 20 MG tablet 3 tabs po day one, then 2 po daily x 4 days 04/01/17   Lurene ShadowPhelps, Kashari Chalmers O, PA-C  traMADol (ULTRAM) 50 MG tablet Take 1 tablet (50 mg total) by mouth every 6 (six) hours as needed. 04/01/17   Lurene ShadowPhelps, Jaylan Duggar O, PA-C    Family History Family History  Problem Relation Age of Onset  . Hypertension Mother   . Diabetes Mother   . Hypertension Father   . CVA Father     Social History Social History   Tobacco Use  . Smoking status: Never Smoker  . Smokeless tobacco: Never Used  Substance Use Topics  . Alcohol use: Not on file  . Drug use: No     Allergies   Norvasc [amlodipine besylate]   Review of Systems Review of Systems  Gastrointestinal: Negative for nausea and vomiting.  Genitourinary: Negative for dysuria, flank pain, frequency and hematuria.  Musculoskeletal: Positive for arthralgias (Right hip), back pain ( Right lower) and myalgias. Negative for neck pain and neck stiffness.  Neurological: Negative for weakness and numbness.     Physical Exam Triage Vital Signs ED Triage  Vitals  Enc Vitals Group     BP 04/01/17 1002 (!) 165/81     Pulse Rate 04/01/17 1002 (!) 53     Resp 04/01/17 1002 18     Temp 04/01/17 1002 97.6 F (36.4 C)     Temp Source 04/01/17 1002 Oral     SpO2 04/01/17 1002 100 %     Weight 04/01/17 1002 156 lb (70.8 kg)     Height 04/01/17 1002 5\' 9"  (1.753 m)     Head Circumference --      Peak Flow --      Pain Score 04/01/17 1003 8     Pain Loc --      Pain Edu? --      Excl. in GC? --    No data found.  Updated Vital Signs BP (!) 165/81 (BP Location: Right Arm)   Pulse (!) 53   Temp 97.6 F (36.4 C) (Oral)   Resp 18   Ht 5\' 9"  (1.753 m)   Wt 156 lb (70.8 kg)   SpO2 100%   BMI 23.04 kg/m   Visual Acuity Right Eye  Distance:   Left Eye Distance:   Bilateral Distance:    Right Eye Near:   Left Eye Near:    Bilateral Near:     Physical Exam  Constitutional: He is oriented to person, place, and time. He appears well-developed and well-nourished. No distress.  HENT:  Head: Normocephalic and atraumatic.  Mouth/Throat: Oropharynx is clear and moist.  Eyes: EOM are normal.  Neck: Normal range of motion.  Cardiovascular: Normal rate.  Pulmonary/Chest: Effort normal. No respiratory distress.  Musculoskeletal: Normal range of motion. He exhibits tenderness. He exhibits no edema.  Tenderness along lower lumbar spine. No step offs or crepitus. Tenderness to Right side lumbar muscles. Negative straight leg raise. Full ROM upper and lower extremities with 5/5 strength Antalgic gait.  Neurological: He is alert and oriented to person, place, and time.  Reflex Scores:      Patellar reflexes are 2+ on the right side and 2+ on the left side. Skin: Skin is warm and dry. He is not diaphoretic.  Psychiatric: He has a normal mood and affect. His behavior is normal.  Nursing note and vitals reviewed.    UC Treatments / Results  Labs (all labs ordered are listed, but only abnormal results are displayed) Labs Reviewed - No data to display  EKG  EKG Interpretation None       Radiology Dg Lumbar Spine Complete  Result Date: 04/01/2017 CLINICAL DATA:  Pt c/o chronic lower back pain which worsened this morning. Denies injury. Pain radiates to right side and down right leg. EXAM: LUMBAR SPINE - COMPLETE 4+ VIEW COMPARISON:  None. FINDINGS: Five lumbar type vertebral bodies. Sacroiliac joints are symmetric. Maintenance of vertebral body height and alignment. Intervertebral disc heights are maintained. Mild facet arthropathy at the lumbosacral junction. Moderate colonic stool burden. IMPRESSION: No acute findings. Electronically Signed   By: Jeronimo Greaves M.D.   On: 04/01/2017 10:53    Procedures Procedures  (including critical care time)  Medications Ordered in UC Medications  methylPREDNISolone acetate (DEPO-MEDROL) injection 80 mg (80 mg Intramuscular Given 04/01/17 1026)     Initial Impression / Assessment and Plan / UC Course  I have reviewed the triage vital signs and the nursing notes.  Pertinent labs & imaging results that were available during my care of the patient were reviewed by me and considered in my medical  decision making (see chart for details).     No acute bony abnormalities on plain films Pt does have some constipation.  Will tx as sciatica Depo-medrol given in UC Encouraged to take prescriptions provided today. Home care instructions provided F/u with PCP or Sports Medicine in 1 week if not improving, sooner if worsening.   Final Clinical Impressions(s) / UC Diagnoses   Final diagnoses:  Acute right-sided low back pain with right-sided sciatica  Acute exacerbation of chronic low back pain    ED Discharge Orders        Ordered    traMADol (ULTRAM) 50 MG tablet  Every 6 hours PRN     04/01/17 1114    predniSONE (DELTASONE) 20 MG tablet     04/01/17 1114    cyclobenzaprine (FLEXERIL) 10 MG tablet  2 times daily PRN     04/01/17 1114    meloxicam (MOBIC) 15 MG tablet  Daily     04/01/17 1114       Controlled Substance Prescriptions [place] Controlled Substance Registry consulted? Yes, I have consulted the [place] Controlled Substances Registry for this patient, and feel the risk/benefit ratio today is favorable for proceeding with this prescription for a controlled substance.   Lurene Shadow, [place] 04/01/17 1358

## 2017-04-01 NOTE — ED Triage Notes (Signed)
Patient reports chronic back pain with acute exacerbation this morning; went to his chiropractor and was give back brace and walker since unable to bear weight on right leg; pain originates lower right back/hip and radiates down right upper leg. Took Naprosen around 0730.

## 2017-04-01 NOTE — Discharge Instructions (Signed)
°  You were given a shot of solumedrol (a steroid) today to help with inflammation in your lungs to help you breath better and to help with your cough.  You have been prescribed 5 days of prednisone, an oral steroid.  You may start this medication tomorrow with breakfast.    Tramadol is strong pain medication. While taking, do not drink alcohol, drive, or perform any other activities that requires focus while taking these medications.   Meloxicam (Mobic) is an antiinflammatory to help with pain and inflammation.  Do not take ibuprofen, Advil, Aleve, or any other medications that contain NSAIDs while taking meloxicam as this may cause stomach upset or even ulcers if taken in large amounts for an extended period of time.   Flexeril (cyclobenzaprine) is a muscle relaxer and may cause drowsiness. Do not drink alcohol, drive, or operate heavy machinery while taking.

## 2017-04-06 ENCOUNTER — Other Ambulatory Visit: Payer: Self-pay | Admitting: Chiropractic Medicine

## 2017-04-06 DIAGNOSIS — M5431 Sciatica, right side: Secondary | ICD-10-CM

## 2017-04-07 ENCOUNTER — Other Ambulatory Visit: Payer: Self-pay | Admitting: Chiropractic Medicine

## 2017-04-07 DIAGNOSIS — Z1389 Encounter for screening for other disorder: Secondary | ICD-10-CM

## 2017-04-10 ENCOUNTER — Other Ambulatory Visit: Payer: BLUE CROSS/BLUE SHIELD

## 2017-04-14 DIAGNOSIS — M7051 Other bursitis of knee, right knee: Secondary | ICD-10-CM | POA: Diagnosis not present

## 2017-04-14 DIAGNOSIS — M25551 Pain in right hip: Secondary | ICD-10-CM | POA: Diagnosis not present

## 2017-04-14 DIAGNOSIS — M25559 Pain in unspecified hip: Secondary | ICD-10-CM | POA: Diagnosis not present

## 2017-04-15 DIAGNOSIS — M25559 Pain in unspecified hip: Secondary | ICD-10-CM | POA: Diagnosis not present

## 2017-04-15 DIAGNOSIS — M25551 Pain in right hip: Secondary | ICD-10-CM | POA: Diagnosis not present

## 2017-04-15 DIAGNOSIS — M7051 Other bursitis of knee, right knee: Secondary | ICD-10-CM | POA: Diagnosis not present

## 2017-04-17 ENCOUNTER — Other Ambulatory Visit: Payer: BLUE CROSS/BLUE SHIELD

## 2017-04-17 DIAGNOSIS — M7051 Other bursitis of knee, right knee: Secondary | ICD-10-CM | POA: Diagnosis not present

## 2017-04-17 DIAGNOSIS — M25559 Pain in unspecified hip: Secondary | ICD-10-CM | POA: Diagnosis not present

## 2017-04-17 DIAGNOSIS — M25551 Pain in right hip: Secondary | ICD-10-CM | POA: Diagnosis not present

## 2017-04-19 DIAGNOSIS — M25559 Pain in unspecified hip: Secondary | ICD-10-CM | POA: Diagnosis not present

## 2017-04-19 DIAGNOSIS — M7051 Other bursitis of knee, right knee: Secondary | ICD-10-CM | POA: Diagnosis not present

## 2017-04-19 DIAGNOSIS — M25551 Pain in right hip: Secondary | ICD-10-CM | POA: Diagnosis not present

## 2017-04-21 DIAGNOSIS — M25551 Pain in right hip: Secondary | ICD-10-CM | POA: Diagnosis not present

## 2017-04-21 DIAGNOSIS — M25559 Pain in unspecified hip: Secondary | ICD-10-CM | POA: Diagnosis not present

## 2017-04-21 DIAGNOSIS — M7051 Other bursitis of knee, right knee: Secondary | ICD-10-CM | POA: Diagnosis not present

## 2017-04-25 DIAGNOSIS — M25559 Pain in unspecified hip: Secondary | ICD-10-CM | POA: Diagnosis not present

## 2017-04-25 DIAGNOSIS — M25551 Pain in right hip: Secondary | ICD-10-CM | POA: Diagnosis not present

## 2017-04-25 DIAGNOSIS — M7051 Other bursitis of knee, right knee: Secondary | ICD-10-CM | POA: Diagnosis not present

## 2017-04-28 DIAGNOSIS — M25551 Pain in right hip: Secondary | ICD-10-CM | POA: Diagnosis not present

## 2017-04-28 DIAGNOSIS — M25559 Pain in unspecified hip: Secondary | ICD-10-CM | POA: Diagnosis not present

## 2017-04-28 DIAGNOSIS — M7051 Other bursitis of knee, right knee: Secondary | ICD-10-CM | POA: Diagnosis not present

## 2017-05-02 DIAGNOSIS — M25551 Pain in right hip: Secondary | ICD-10-CM | POA: Diagnosis not present

## 2017-05-02 DIAGNOSIS — M7051 Other bursitis of knee, right knee: Secondary | ICD-10-CM | POA: Diagnosis not present

## 2017-05-02 DIAGNOSIS — M25559 Pain in unspecified hip: Secondary | ICD-10-CM | POA: Diagnosis not present

## 2017-05-08 DIAGNOSIS — M7051 Other bursitis of knee, right knee: Secondary | ICD-10-CM | POA: Diagnosis not present

## 2017-05-08 DIAGNOSIS — M25551 Pain in right hip: Secondary | ICD-10-CM | POA: Diagnosis not present

## 2017-05-08 DIAGNOSIS — M25559 Pain in unspecified hip: Secondary | ICD-10-CM | POA: Diagnosis not present

## 2017-05-12 DIAGNOSIS — M25559 Pain in unspecified hip: Secondary | ICD-10-CM | POA: Diagnosis not present

## 2017-05-12 DIAGNOSIS — M25551 Pain in right hip: Secondary | ICD-10-CM | POA: Diagnosis not present

## 2017-05-12 DIAGNOSIS — M7051 Other bursitis of knee, right knee: Secondary | ICD-10-CM | POA: Diagnosis not present

## 2017-05-16 DIAGNOSIS — M7051 Other bursitis of knee, right knee: Secondary | ICD-10-CM | POA: Diagnosis not present

## 2017-05-16 DIAGNOSIS — M25551 Pain in right hip: Secondary | ICD-10-CM | POA: Diagnosis not present

## 2017-05-16 DIAGNOSIS — M25559 Pain in unspecified hip: Secondary | ICD-10-CM | POA: Diagnosis not present

## 2017-05-18 DIAGNOSIS — E782 Mixed hyperlipidemia: Secondary | ICD-10-CM | POA: Diagnosis not present

## 2017-05-18 DIAGNOSIS — I10 Essential (primary) hypertension: Secondary | ICD-10-CM | POA: Diagnosis not present

## 2017-05-18 DIAGNOSIS — M5416 Radiculopathy, lumbar region: Secondary | ICD-10-CM | POA: Diagnosis not present

## 2017-05-18 DIAGNOSIS — Z0000 Encounter for general adult medical examination without abnormal findings: Secondary | ICD-10-CM | POA: Diagnosis not present

## 2017-05-18 DIAGNOSIS — E890 Postprocedural hypothyroidism: Secondary | ICD-10-CM | POA: Diagnosis not present

## 2017-05-19 ENCOUNTER — Other Ambulatory Visit: Payer: Self-pay | Admitting: Family Medicine

## 2017-05-19 DIAGNOSIS — M5416 Radiculopathy, lumbar region: Secondary | ICD-10-CM

## 2017-05-23 DIAGNOSIS — M25559 Pain in unspecified hip: Secondary | ICD-10-CM | POA: Diagnosis not present

## 2017-05-23 DIAGNOSIS — M25551 Pain in right hip: Secondary | ICD-10-CM | POA: Diagnosis not present

## 2017-05-23 DIAGNOSIS — M7051 Other bursitis of knee, right knee: Secondary | ICD-10-CM | POA: Diagnosis not present

## 2017-05-29 ENCOUNTER — Ambulatory Visit (INDEPENDENT_AMBULATORY_CARE_PROVIDER_SITE_OTHER): Payer: BLUE CROSS/BLUE SHIELD

## 2017-05-29 DIAGNOSIS — M48061 Spinal stenosis, lumbar region without neurogenic claudication: Secondary | ICD-10-CM

## 2017-05-29 DIAGNOSIS — Z135 Encounter for screening for eye and ear disorders: Secondary | ICD-10-CM | POA: Diagnosis not present

## 2017-05-29 DIAGNOSIS — M5116 Intervertebral disc disorders with radiculopathy, lumbar region: Secondary | ICD-10-CM | POA: Diagnosis not present

## 2017-05-29 DIAGNOSIS — Z0189 Encounter for other specified special examinations: Secondary | ICD-10-CM

## 2017-05-29 DIAGNOSIS — M5117 Intervertebral disc disorders with radiculopathy, lumbosacral region: Secondary | ICD-10-CM

## 2017-05-29 DIAGNOSIS — M5416 Radiculopathy, lumbar region: Secondary | ICD-10-CM

## 2017-05-29 DIAGNOSIS — M5126 Other intervertebral disc displacement, lumbar region: Secondary | ICD-10-CM | POA: Diagnosis not present

## 2017-05-29 DIAGNOSIS — Z77018 Contact with and (suspected) exposure to other hazardous metals: Secondary | ICD-10-CM

## 2017-05-29 DIAGNOSIS — Z1389 Encounter for screening for other disorder: Secondary | ICD-10-CM

## 2017-05-31 DIAGNOSIS — M546 Pain in thoracic spine: Secondary | ICD-10-CM | POA: Diagnosis not present

## 2017-05-31 DIAGNOSIS — M356 Relapsing panniculitis [Weber-Christian]: Secondary | ICD-10-CM | POA: Diagnosis not present

## 2017-05-31 DIAGNOSIS — G5720 Lesion of femoral nerve, unspecified lower limb: Secondary | ICD-10-CM | POA: Diagnosis not present

## 2017-05-31 DIAGNOSIS — M25559 Pain in unspecified hip: Secondary | ICD-10-CM | POA: Diagnosis not present

## 2017-06-02 DIAGNOSIS — M356 Relapsing panniculitis [Weber-Christian]: Secondary | ICD-10-CM | POA: Diagnosis not present

## 2017-06-02 DIAGNOSIS — G5720 Lesion of femoral nerve, unspecified lower limb: Secondary | ICD-10-CM | POA: Diagnosis not present

## 2017-06-02 DIAGNOSIS — M25559 Pain in unspecified hip: Secondary | ICD-10-CM | POA: Diagnosis not present

## 2017-06-02 DIAGNOSIS — M546 Pain in thoracic spine: Secondary | ICD-10-CM | POA: Diagnosis not present

## 2017-06-05 DIAGNOSIS — M356 Relapsing panniculitis [Weber-Christian]: Secondary | ICD-10-CM | POA: Diagnosis not present

## 2017-06-05 DIAGNOSIS — M25559 Pain in unspecified hip: Secondary | ICD-10-CM | POA: Diagnosis not present

## 2017-06-05 DIAGNOSIS — G5720 Lesion of femoral nerve, unspecified lower limb: Secondary | ICD-10-CM | POA: Diagnosis not present

## 2017-06-05 DIAGNOSIS — M546 Pain in thoracic spine: Secondary | ICD-10-CM | POA: Diagnosis not present

## 2017-06-07 DIAGNOSIS — M25559 Pain in unspecified hip: Secondary | ICD-10-CM | POA: Diagnosis not present

## 2017-06-07 DIAGNOSIS — M356 Relapsing panniculitis [Weber-Christian]: Secondary | ICD-10-CM | POA: Diagnosis not present

## 2017-06-07 DIAGNOSIS — G5720 Lesion of femoral nerve, unspecified lower limb: Secondary | ICD-10-CM | POA: Diagnosis not present

## 2017-06-07 DIAGNOSIS — M546 Pain in thoracic spine: Secondary | ICD-10-CM | POA: Diagnosis not present

## 2017-06-13 DIAGNOSIS — G5720 Lesion of femoral nerve, unspecified lower limb: Secondary | ICD-10-CM | POA: Diagnosis not present

## 2017-06-13 DIAGNOSIS — M356 Relapsing panniculitis [Weber-Christian]: Secondary | ICD-10-CM | POA: Diagnosis not present

## 2017-06-13 DIAGNOSIS — M25559 Pain in unspecified hip: Secondary | ICD-10-CM | POA: Diagnosis not present

## 2017-06-13 DIAGNOSIS — I10 Essential (primary) hypertension: Secondary | ICD-10-CM | POA: Diagnosis not present

## 2017-06-13 DIAGNOSIS — M5126 Other intervertebral disc displacement, lumbar region: Secondary | ICD-10-CM | POA: Diagnosis not present

## 2017-06-13 DIAGNOSIS — M546 Pain in thoracic spine: Secondary | ICD-10-CM | POA: Diagnosis not present

## 2017-06-16 DIAGNOSIS — I10 Essential (primary) hypertension: Secondary | ICD-10-CM | POA: Diagnosis not present

## 2017-06-16 DIAGNOSIS — M546 Pain in thoracic spine: Secondary | ICD-10-CM | POA: Diagnosis not present

## 2017-06-16 DIAGNOSIS — M25559 Pain in unspecified hip: Secondary | ICD-10-CM | POA: Diagnosis not present

## 2017-06-16 DIAGNOSIS — M356 Relapsing panniculitis [Weber-Christian]: Secondary | ICD-10-CM | POA: Diagnosis not present

## 2017-06-16 DIAGNOSIS — G5720 Lesion of femoral nerve, unspecified lower limb: Secondary | ICD-10-CM | POA: Diagnosis not present

## 2017-06-23 DIAGNOSIS — M356 Relapsing panniculitis [Weber-Christian]: Secondary | ICD-10-CM | POA: Diagnosis not present

## 2017-06-23 DIAGNOSIS — G5720 Lesion of femoral nerve, unspecified lower limb: Secondary | ICD-10-CM | POA: Diagnosis not present

## 2017-06-23 DIAGNOSIS — M25559 Pain in unspecified hip: Secondary | ICD-10-CM | POA: Diagnosis not present

## 2017-06-23 DIAGNOSIS — M546 Pain in thoracic spine: Secondary | ICD-10-CM | POA: Diagnosis not present

## 2017-06-28 DIAGNOSIS — M546 Pain in thoracic spine: Secondary | ICD-10-CM | POA: Diagnosis not present

## 2017-06-28 DIAGNOSIS — M356 Relapsing panniculitis [Weber-Christian]: Secondary | ICD-10-CM | POA: Diagnosis not present

## 2017-06-28 DIAGNOSIS — M25559 Pain in unspecified hip: Secondary | ICD-10-CM | POA: Diagnosis not present

## 2017-06-28 DIAGNOSIS — G5720 Lesion of femoral nerve, unspecified lower limb: Secondary | ICD-10-CM | POA: Diagnosis not present

## 2017-07-03 DIAGNOSIS — M25559 Pain in unspecified hip: Secondary | ICD-10-CM | POA: Diagnosis not present

## 2017-07-03 DIAGNOSIS — G5720 Lesion of femoral nerve, unspecified lower limb: Secondary | ICD-10-CM | POA: Diagnosis not present

## 2017-07-03 DIAGNOSIS — M356 Relapsing panniculitis [Weber-Christian]: Secondary | ICD-10-CM | POA: Diagnosis not present

## 2017-07-03 DIAGNOSIS — M546 Pain in thoracic spine: Secondary | ICD-10-CM | POA: Diagnosis not present

## 2017-07-11 DIAGNOSIS — M5126 Other intervertebral disc displacement, lumbar region: Secondary | ICD-10-CM | POA: Diagnosis not present

## 2017-07-11 DIAGNOSIS — I10 Essential (primary) hypertension: Secondary | ICD-10-CM | POA: Diagnosis not present

## 2017-07-14 DIAGNOSIS — G5720 Lesion of femoral nerve, unspecified lower limb: Secondary | ICD-10-CM | POA: Diagnosis not present

## 2017-07-14 DIAGNOSIS — M25559 Pain in unspecified hip: Secondary | ICD-10-CM | POA: Diagnosis not present

## 2017-07-14 DIAGNOSIS — M546 Pain in thoracic spine: Secondary | ICD-10-CM | POA: Diagnosis not present

## 2017-07-14 DIAGNOSIS — M356 Relapsing panniculitis [Weber-Christian]: Secondary | ICD-10-CM | POA: Diagnosis not present

## 2017-07-19 DIAGNOSIS — M356 Relapsing panniculitis [Weber-Christian]: Secondary | ICD-10-CM | POA: Diagnosis not present

## 2017-07-19 DIAGNOSIS — M546 Pain in thoracic spine: Secondary | ICD-10-CM | POA: Diagnosis not present

## 2017-07-19 DIAGNOSIS — M25559 Pain in unspecified hip: Secondary | ICD-10-CM | POA: Diagnosis not present

## 2017-07-19 DIAGNOSIS — G5720 Lesion of femoral nerve, unspecified lower limb: Secondary | ICD-10-CM | POA: Diagnosis not present

## 2017-07-26 DIAGNOSIS — G589 Mononeuropathy, unspecified: Secondary | ICD-10-CM | POA: Diagnosis not present

## 2017-07-26 DIAGNOSIS — M25559 Pain in unspecified hip: Secondary | ICD-10-CM | POA: Diagnosis not present

## 2017-07-26 DIAGNOSIS — M4696 Unspecified inflammatory spondylopathy, lumbar region: Secondary | ICD-10-CM | POA: Diagnosis not present

## 2017-07-26 DIAGNOSIS — M546 Pain in thoracic spine: Secondary | ICD-10-CM | POA: Diagnosis not present

## 2017-07-31 DIAGNOSIS — M4696 Unspecified inflammatory spondylopathy, lumbar region: Secondary | ICD-10-CM | POA: Diagnosis not present

## 2017-07-31 DIAGNOSIS — M25559 Pain in unspecified hip: Secondary | ICD-10-CM | POA: Diagnosis not present

## 2017-07-31 DIAGNOSIS — M546 Pain in thoracic spine: Secondary | ICD-10-CM | POA: Diagnosis not present

## 2017-08-09 DIAGNOSIS — M25559 Pain in unspecified hip: Secondary | ICD-10-CM | POA: Diagnosis not present

## 2017-08-09 DIAGNOSIS — M4696 Unspecified inflammatory spondylopathy, lumbar region: Secondary | ICD-10-CM | POA: Diagnosis not present

## 2017-08-09 DIAGNOSIS — M546 Pain in thoracic spine: Secondary | ICD-10-CM | POA: Diagnosis not present

## 2017-08-24 DIAGNOSIS — M4696 Unspecified inflammatory spondylopathy, lumbar region: Secondary | ICD-10-CM | POA: Diagnosis not present

## 2017-08-24 DIAGNOSIS — M546 Pain in thoracic spine: Secondary | ICD-10-CM | POA: Diagnosis not present

## 2017-08-24 DIAGNOSIS — M25559 Pain in unspecified hip: Secondary | ICD-10-CM | POA: Diagnosis not present

## 2017-08-29 DIAGNOSIS — M25559 Pain in unspecified hip: Secondary | ICD-10-CM | POA: Diagnosis not present

## 2017-08-29 DIAGNOSIS — M546 Pain in thoracic spine: Secondary | ICD-10-CM | POA: Diagnosis not present

## 2017-08-29 DIAGNOSIS — M4696 Unspecified inflammatory spondylopathy, lumbar region: Secondary | ICD-10-CM | POA: Diagnosis not present

## 2017-10-11 ENCOUNTER — Emergency Department
Admission: EM | Admit: 2017-10-11 | Discharge: 2017-10-11 | Disposition: A | Discharge status: Home / Self Care | Payer: BLUE CROSS/BLUE SHIELD | Source: Home / Self Care | Attending: Family Medicine | Admitting: Family Medicine

## 2017-10-11 ENCOUNTER — Encounter: Payer: Self-pay | Admitting: *Deleted

## 2017-10-11 ENCOUNTER — Other Ambulatory Visit: Payer: Self-pay

## 2017-10-11 DIAGNOSIS — M5417 Radiculopathy, lumbosacral region: Secondary | ICD-10-CM | POA: Diagnosis not present

## 2017-10-11 MED ORDER — TRAMADOL HCL 50 MG PO TABS: 50.0000 mg | ORAL_TABLET | Freq: Four times a day (QID) | ORAL | 0 refills | Status: DC | PRN

## 2017-10-11 MED ORDER — PREDNISONE 20 MG PO TABS: ORAL_TABLET | ORAL | 0 refills | Status: DC

## 2017-10-11 MED ORDER — CYCLOBENZAPRINE HCL 10 MG PO TABS: 10.0000 mg | ORAL_TABLET | Freq: Two times a day (BID) | ORAL | 1 refills | Status: DC | PRN

## 2017-10-11 NOTE — ED Provider Notes (Signed)
[facility]    CSN: [id number] Arrival date & time: 10/11/17  1719     History   Chief Complaint Chief Complaint  Patient presents with  . Back Pain    HPI Shane Blackburn is a 47 y.o. male.   Patient has a history of chronic right herniated lumbar disc with right radicular pain.  He was stable while undergoing chiropractic treatment until 6 weeks ago when he began to increase his activity at the gym.  10 days ago he developed new pain in his left lower back that radiates to his left leg.  The pain is better when he sits and leans forward, and worse with standing and supine on his back.   He denies bowel or bladder dysfunction, and no saddle numbness.   He has an appointment scheduled with a neurosurgeon on August 20, but is planning to leave on a trip to [place] in 3 days, and is concerned that his pain may become disabling.  Review of MRI lumbar spine without contrast done 05/29/17 reveals the following: IMPRESSION: 1. Large right foraminal and extraforaminal disc extrusion at L4-5 with right L4 nerve impingement. 2. Left paracentral/subarticular disc extrusion at L5-S1 with potential S1 nerve root impingement. 3. Leftward disc protrusions at L2-3 and L3-4 with resultant left neural foraminal stenosis and potential left L2 and L3 nerve root impingement, respectively.  The history is provided by the patient.  Back Pain  Location:  Lumbar spine Quality:  Aching Radiates to:  L thigh Pain severity:  Moderate Pain is:  Same all the time Onset quality:  Sudden Duration:  10 days Timing:  Constant Progression:  Unchanged Chronicity:  New Context comment:  Increased exercise Relieved by:  Nothing Exacerbated by: activity, standing. Ineffective treatments:  Cold packs Associated symptoms: leg pain and paresthesias   Associated symptoms: no abdominal pain, no bladder incontinence, no bowel incontinence, no dysuria, no fever, no numbness, no pelvic pain, no perianal  numbness, no tingling, no weakness and no weight loss     Past Medical History:  Diagnosis Date  . Benign essential HTN 03/03/2015  . Depression   . Dyslipidemia   . GERD (gastroesophageal reflux disease)   . Hypertension   . Hyperthyroidism    now hypothyroidism  . OSA (obstructive sleep apnea) 03/03/2015    Patient Active Problem List   Diagnosis Date Noted  . OSA (obstructive sleep apnea) 03/03/2015  . Benign essential HTN 03/03/2015  . HYPOTHYROIDISM 02/14/2009  . HYPERLIPIDEMIA 02/14/2009  . DEPRESSION 02/14/2009  . ACUTE PHARYNGITIS 02/14/2009  . GERD 02/14/2009  . 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 FINGER SURGERY  2010   finger tip  . WISDOM TOOTH EXTRACTION  1996       Home Medications    Prior to Admission medications   Medication Sig Start Date End Date Taking? Authorizing Provider  lisinopril (PRINIVIL,ZESTRIL) 20 MG tablet Take 20 mg by mouth daily.   Yes [provider]  cyclobenzaprine (FLEXERIL) 10 MG tablet Take 1 tablet (10 mg total) by mouth 2 (two) times daily as needed for muscle spasms. 10/11/17   Lattie Haw, MD  levothyroxine (SYNTHROID, LEVOTHROID) 137 MCG tablet Take 112 mcg by mouth daily before breakfast.     [provider]  predniSONE (DELTASONE) 20 MG tablet Take one tab by mouth twice daily for 5 days, then one daily. Take with food. 10/11/17   Huldah Marin,  Tera MaterStephen A, MD  traMADol (ULTRAM) 50 MG tablet Take 1 tablet (50 mg total) by mouth every 6 (six) hours as needed for moderate pain. 10/11/17   Lattie HawBeese, Charnise Lovan A, MD    Family History Family History  Problem Relation Age of Onset  . Hypertension Mother   . Diabetes Mother   . Hypertension Father   . CVA Father     Social History Social History   Tobacco Use  . Smoking status: Never Smoker  . Smokeless tobacco: Never Used  Substance Use Topics  . Alcohol use: Not on file  . Drug  use: No     Allergies   Norvasc [amlodipine besylate]   Review of Systems Review of Systems  Constitutional: Negative for fever and weight loss.  Gastrointestinal: Negative for abdominal pain and bowel incontinence.  Genitourinary: Negative for bladder incontinence, dysuria and pelvic pain.  Musculoskeletal: Positive for back pain.  Neurological: Positive for paresthesias. Negative for tingling, weakness and numbness.  All other systems reviewed and are negative.    Physical Exam Triage Vital Signs ED Triage Vitals [10/11/17 1750]  Enc Vitals Group     BP (!) 160/92     Pulse Rate 74     Resp      Temp      Temp src      SpO2 98 %     Weight 168 lb (76.2 kg)     Height      Head Circumference      Peak Flow      Pain Score 7     Pain Loc      Pain Edu?      Excl. in GC?    No data found.  Updated Vital Signs BP (!) 160/92 (BP Location: Right Arm)   Pulse 74   Wt 168 lb (76.2 kg)   SpO2 98%   BMI 24.81 kg/m   Visual Acuity Right Eye Distance:   Left Eye Distance:   Bilateral Distance:    Right Eye Near:   Left Eye Near:    Bilateral Near:     Physical Exam Nursing notes and Vital Signs reviewed. Appearance:  Patient appears stated age, and in no acute distress.    Eyes:  Pupils are equal, round, and reactive to light and accomodation.  Extraocular movement is intact.  Conjunctivae are not inflamed   Pharynx:  Normal; moist mucous membranes  Neck:  Supple.  No adenopathy Lungs:  Clear to auscultation.  Breath sounds are equal.  Moving air well. Heart:  Regular rate and rhythm without murmurs, rubs, or gallops.  Abdomen:  Nontender without masses or hepatosplenomegaly.  Bowel sounds are present.  No CVA or flank tenderness.  Extremities:  No edema.  Skin:  No rash present.    Back:   Decreased range of motion.  Tenderness in the midline and left paraspinous muscles from L3 to Sacral area.  Straight leg raising test is positive on the left.  Sitting  knee extension test is positive on the left.  Strength and sensation in the lower extremities is normal.  Patellar and achilles reflexes are absent on the right, and present on the left. UC Treatments / Results  Labs (all labs ordered are listed, but only abnormal results are displayed) Labs Reviewed - No data to display  EKG None  Radiology No results found.  Procedures Procedures (including critical care time)  Medications Ordered in UC Medications - No data to display  Initial Impression /  Assessment and Plan / UC Course  I have reviewed the triage vital signs and the nursing notes.  Pertinent labs & imaging results that were available during my care of the patient were reviewed by me and considered in my medical decision making (see chart for details).    Begin prednisone burst/taper. Rx for Flexeril and Tramadol. Controlled Substance Prescriptions I have consulted the [place] Controlled Substances Registry for this patient, and feel the risk/benefit ratio today is favorable for proceeding with this prescription for a controlled substance.   Followup with neurosurgeon as previously scheduled.   Final Clinical Impressions(s) / UC Diagnoses   Final diagnoses:  Radiculopathy of lumbosacral region     Discharge Instructions     Apply ice pack for 20 to 30 minutes, 3 to 4 times daily  Continue until pain decreases. If symptoms become significantly worse during the night or over the weekend, proceed to the local emergency room.     ED Prescriptions    Medication Sig Dispense Auth. Provider   predniSONE (DELTASONE) 20 MG tablet Take one tab by mouth twice daily for 5 days, then one daily. Take with food. 14 tablet Lattie Haw, MD   cyclobenzaprine (FLEXERIL) 10 MG tablet Take 1 tablet (10 mg total) by mouth 2 (two) times daily as needed for muscle spasms. 20 tablet Lattie Haw, MD   traMADol (ULTRAM) 50 MG tablet Take 1 tablet (50 mg total) by mouth every 6 (six)  hours as needed for moderate pain. 15 tablet Lattie Haw, MD        Lattie Haw, MD 10/12/17 [phone]

## 2017-10-11 NOTE — ED Triage Notes (Signed)
Patient has herniated disc affecting right side. For past week c/o pain on left side after going to the gym. He can only sit comfortably. He a has a long trip coming up. He went to chiropractor who recommenced coming here. He plans to see the neurosurgeon previously seen on August 24th.

## 2017-10-11 NOTE — Discharge Instructions (Addendum)
Apply ice pack for 20 to 30 minutes, 3 to 4 times daily  Continue until pain decreases. If symptoms become significantly worse during the night or over the weekend, proceed to the local emergency room.

## 2017-11-07 DIAGNOSIS — M5416 Radiculopathy, lumbar region: Secondary | ICD-10-CM | POA: Diagnosis not present

## 2017-11-08 ENCOUNTER — Encounter: Payer: BLUE CROSS/BLUE SHIELD | Admitting: Family Medicine

## 2017-11-08 ENCOUNTER — Other Ambulatory Visit: Payer: Self-pay | Admitting: Neurological Surgery

## 2017-11-08 DIAGNOSIS — M5416 Radiculopathy, lumbar region: Secondary | ICD-10-CM

## 2017-11-21 ENCOUNTER — Ambulatory Visit (INDEPENDENT_AMBULATORY_CARE_PROVIDER_SITE_OTHER): Payer: BLUE CROSS/BLUE SHIELD

## 2017-11-21 DIAGNOSIS — M48061 Spinal stenosis, lumbar region without neurogenic claudication: Secondary | ICD-10-CM

## 2017-11-21 DIAGNOSIS — M5116 Intervertebral disc disorders with radiculopathy, lumbar region: Secondary | ICD-10-CM | POA: Diagnosis not present

## 2017-11-21 DIAGNOSIS — M5416 Radiculopathy, lumbar region: Secondary | ICD-10-CM

## 2017-11-21 DIAGNOSIS — M5126 Other intervertebral disc displacement, lumbar region: Secondary | ICD-10-CM | POA: Diagnosis not present

## 2017-11-24 ENCOUNTER — Encounter: Payer: Self-pay | Admitting: Family Medicine

## 2017-11-24 ENCOUNTER — Ambulatory Visit (INDEPENDENT_AMBULATORY_CARE_PROVIDER_SITE_OTHER): Payer: BLUE CROSS/BLUE SHIELD | Admitting: Family Medicine

## 2017-11-24 VITALS — BP 130/87 | HR 99 | Wt 166.0 lb

## 2017-11-24 DIAGNOSIS — M5441 Lumbago with sciatica, right side: Secondary | ICD-10-CM

## 2017-11-24 DIAGNOSIS — M7061 Trochanteric bursitis, right hip: Secondary | ICD-10-CM | POA: Diagnosis not present

## 2017-11-24 DIAGNOSIS — M5442 Lumbago with sciatica, left side: Secondary | ICD-10-CM

## 2017-11-24 DIAGNOSIS — M222X2 Patellofemoral disorders, left knee: Secondary | ICD-10-CM

## 2017-11-24 DIAGNOSIS — M222X1 Patellofemoral disorders, right knee: Secondary | ICD-10-CM | POA: Diagnosis not present

## 2017-11-24 NOTE — Progress Notes (Signed)
Subjective:    CC: Back pain  HPI: Shane Blackburn presents to clinic today for a second opinion and evaluation for his back and leg pain.   He has a 9 month history of back and leg pain.  Starting in January 2018 he developed pain radiating down his right leg.  He was eventually referred to neurosurgery.  He was eventually found to have right L4 radicular pain.  He was treated conservatively and had near complete resolution of his right-sided leg pain.  He did chiropractic care and some home exercise program.  He was doing reasonably well until June 2019 when he developed new onset of left low back pain and pain radiating to the left anterior thigh.  The pain has been ongoing since June.  Additionally he has been completing home exercise program and continuing his chiropractic care.  He has had trials of prednisone and Flexeril which helped only a little.  His neurosurgeon ordered an MRI which was done on the third showing new disc herniation and potential new nerve compression at the left L3 nerve root.  He has a scheduled follow-up appointment with neurosurgery next week.  He is here today for a second opinion.  He is bothered by his continued left low back pain and left thigh pain.  He denies new weakness or numbness or bowel bladder dysfunction.  Past medical history, Surgical history, Family history not pertinant except as noted below, Social history, Allergies, and medications have been entered into the medical record, reviewed, and no changes needed.   Review of Systems: No headache, visual changes, nausea, vomiting, diarrhea, constipation, dizziness, abdominal pain, skin rash, fevers, chills, night sweats, weight loss, swollen lymph nodes, body aches, joint swelling, muscle aches, chest pain, shortness of breath, mood changes, visual or auditory hallucinations.   Objective:    Vitals:   11/24/17 0829  BP: 130/87  Pulse: 99   General: Well Developed, well nourished, and in no acute  distress.  Neuro/Psych: Alert and oriented x3, extra-ocular muscles intact, able to move all 4 extremities, sensation grossly intact. Skin: Warm and dry, no rashes noted.  Respiratory: Not using accessory muscles, speaking in full sentences, trachea midline.  Cardiovascular: Pulses palpable, no extremity edema. Abdomen: Does not appear distended. MSK:  L-spine: Nontender to spinal midline.  Tender palpation left paraspinal muscle group and SI joint region. Lumbar motion is intact pain with flexion. Negative slump test bilaterally. Lower extremity strength is intact with the exception of knee extension bilaterally which is 4/5 left and 4+/5 right Reflexes and sensation are equal and normal bilaterally.  Right hip: Normal-appearing normal motion. Tender to palpation greater trochanter.  Hip strength is intact.  Left hip: Normal-appearing nontender normal motion normal strength.  Right knee pain normal-appearing nontender normal motion.  Stable ligaments exam.  Left knee: Normal-appearing mildly tender to palpation at patellar tendon. Normal motion. Stable ligamentous exam.  Lab and Radiology Results No results found for this or any previous visit (from the past 72 hour(s)). Mr Lumbar Spine Wo Contrast  Result Date: 11/21/2017 CLINICAL DATA:  Lumbar radiculopathy. Anterior left thigh and knee pain with numbness. Right anterior and lateral thigh pain. EXAM: MRI LUMBAR SPINE WITHOUT CONTRAST TECHNIQUE: Multiplanar, multisequence MR imaging of the lumbar spine was performed. No intravenous contrast was administered. COMPARISON:  05/29/2017 FINDINGS: Segmentation:  Standard.  Minimally prominent S1-2 disc. Alignment:  Unchanged trace retrolisthesis of L5 on S1. Vertebrae: No fracture, suspicious osseous lesion, or significant marrow edema. Conus medullaris and cauda  equina: Conus extends to the L1-2 level. Conus and cauda equina appear normal. Paraspinal and other soft tissues: Unremarkable. Disc  levels: Disc desiccation from L2-3 to L5-S1 with only at most mild disc space narrowing. T12-L1 and L1-2: Negative. L2-3: Left foraminal to extraforaminal disc protrusion with annular fissure results in mild spinal stenosis and potential impingement of the extraforaminal left L2 nerve, unchanged. Patent spinal canal and right neural foramen. L3-4: A left foraminal disc extrusion is larger than on the prior study and results in moderate left neural foraminal stenosis and likely impingement of the left L3 nerve root. Patent spinal canal and right neural foramen. L4-5: The large right foraminal and extraforaminal disc extrusion on the prior study has decreased in size. There is a residual right foraminal and extraforaminal disc protrusion with annular fissure which results in moderate residual right neural foraminal stenosis, potentially still affecting the right L4 nerve. Mild right lateral recess narrowing is unchanged. Mild left neural foraminal stenosis due to mild disc bulging is also unchanged. No significant spinal stenosis. L5-S1: Mild circumferential disc bulging and a central to left subarticular disc extrusion with minimal caudal migration result in moderate left lateral recess stenosis and moderate left neural foraminal stenosis with potential left L5 and left S1 nerve root impingement, unchanged. Disc also may contact the right S1 nerve root in the canal as before. No significant generalized spinal stenosis. IMPRESSION: 1. Increased size of left foraminal disc extrusion at L3-4 with increased foraminal stenosis and left L3 nerve root impingement. 2. Decreased size of right foraminal/extraforaminal disc extrusion at L4-5. 3. Unchanged L5-S1 disc extrusion and disc bulging with moderate left lateral recess and left neural foraminal stenosis. 4. Unchanged left foraminal/extraforaminal disc protrusion at L2-3. Electronically Signed   By: Sebastian Ache M.D.   On: 11/21/2017 08:42  I personally (independently)  visualized and performed the interpretation of the images attached in this note.   Impression and Recommendations:    Assessment and Plan: 47 y.o. male with  Low back pain with new left L3 radiculopathy.. Patient is failing typical early conservative management for this problem.  Plan to proceed with a trial of physical therapy as well as epidural steroid injection.  I believe the back pain is more likely myofascial disruption and pain.  He may have some component of discogenic pain as well.  Physical therapy should certainly help the low back pain and potentially help the lumbar radiculopathy.  Epidural steroid injection will very likely help his radicular pain. Recheck in about 6 weeks.  Right lateral hip pain: Trochanteric bursitis versus hip abductor tendinopathy.  This will be very likely improved with physical therapy.  Plan for referral to physical therapy and recheck in about 6 weeks.  Leg weakness: Patient has subtle weakness of left knee extension.  This certainly could be from L3 radiculopathy.  Additionally he may have a component of patellofemoral pain syndrome causing some of his anterior knee pain and weakness.  Again plan for physical therapy and treatment as above.  HU:DJSH, Darlen Round, MD Phone: [phone]; Fax: [phone]  Tia Alert, MD  Phone: [phone]; Fax: [phone]    Orders Placed This Encounter  Procedures  . DG Epidurography    Order Specific Question:   Reason for Exam (SYMPTOM  OR DIAGNOSIS REQUIRED)    Answer:   Left L3-L4 level for left L3 radicular symptoms    Order Specific Question:   Preferred imaging location?    Answer:   [id number]  .  Ambulatory referral to Physical Therapy    Referral Priority:   Routine    Referral Type:   Physical Medicine    Referral Reason:   Specialty Services Required    Requested Specialty:   Physical Therapy   No orders of the defined types were placed in this encounter.   Discussed warning  signs or symptoms. Please see discharge instructions. Patient expresses understanding.

## 2017-11-24 NOTE — Patient Instructions (Addendum)
Thank you for coming in today. Come back or go to the emergency room if you notice new weakness new numbness problems walking or bowel or bladder problems.  Attend Physical therapy.  Schedule your appointment today.   I recommend proceeding with epidural steroid injections.    Recheck in 6 weeks or sooner if needed.   Come back or go to the emergency room if you notice new weakness new numbness problems walking or bowel or bladder problems.  I will send a copy of today's note to Dr Tenny Craw and Dr Yetta Barre.   Epidural Steroid Injection Patient Information  Description: The epidural space surrounds the nerves as they exit the spinal cord.  In some patients, the nerves can be compressed and inflamed by a bulging disc or a tight spinal canal (spinal stenosis).  By injecting steroids into the epidural space, we can bring irritated nerves into direct contact with a potentially helpful medication.  These steroids act directly on the irritated nerves and can reduce swelling and inflammation which often leads to decreased pain.  Epidural steroids may be injected anywhere along the spine and from the neck to the low back depending upon the location of your pain.   After numbing the skin with local anesthetic (like Novocaine), a small needle is passed into the epidural space slowly.  You may experience a sensation of pressure while this is being done.  The entire block usually last less than 10 minutes.  Conditions which may be treated by epidural steroids:   Low back and leg pain  Neck and arm pain  Spinal stenosis  Post-laminectomy syndrome  Herpes zoster (shingles) pain  Pain from compression fractures  Preparation for the injection:  1. Do not eat any solid food or dairy products within 8 hours of your appointment.  2. You may drink clear liquids up to 3 hours before appointment.  Clear liquids include water, black coffee, juice or soda.  No milk or cream please. 3. You may take your  regular medication, including pain medications, with a sip of water before your appointment  Diabetics should hold regular insulin (if taken separately) and take 1/2 normal NPH dos the morning of the procedure.  Carry some sugar containing items with you to your appointment. 4. A driver must accompany you and be prepared to drive you home after your procedure.  5. Bring all your current medications with your. 6. An IV may be inserted and sedation may be given at the discretion of the physician.   7. A blood pressure cuff, EKG and other monitors will often be applied during the procedure.  Some patients may need to have extra oxygen administered for a short period. 8. You will be asked to provide medical information, including your allergies, prior to the procedure.  We must know immediately if you are taking blood thinners (like Coumadin/Warfarin)  Or if you are allergic to IV iodine contrast (dye). We must know if you could possible be pregnant.  Possible side-effects:  Bleeding from needle site  Infection (rare, may require surgery)  Nerve injury (rare)  Numbness & tingling (temporary)  Difficulty urinating (rare, temporary)  Spinal headache ( a headache worse with upright posture)  Light -headedness (temporary)  Pain at injection site (several days)  Decreased blood pressure (temporary)  Weakness in arm/leg (temporary)  Pressure sensation in back/neck (temporary)  Call if you experience:  Fever/chills associated with headache or increased back/neck pain.  Headache worsened by an upright position.  New onset  weakness or numbness of an extremity below the injection site  Hives or difficulty breathing (go to the emergency room)  Inflammation or drainage at the infection site  Severe back/neck pain  Any new symptoms which are concerning to you  Please note:  Although the local anesthetic injected can often make your back or neck feel good for several hours after the  injection, the pain will likely return.  It takes 3-7 days for steroids to work in the epidural space.  You may not notice any pain relief for at least that one week.  If effective, we will often do a series of three injections spaced 3-6 weeks apart to maximally decrease your pain.  After the initial series, we generally will wait several months before considering a repeat injection of the same type.  \

## 2017-11-24 NOTE — Progress Notes (Signed)
Faxed to both providers and confirmation received

## 2017-11-27 DIAGNOSIS — M5126 Other intervertebral disc displacement, lumbar region: Secondary | ICD-10-CM | POA: Diagnosis not present

## 2017-11-29 ENCOUNTER — Ambulatory Visit (INDEPENDENT_AMBULATORY_CARE_PROVIDER_SITE_OTHER): Payer: BLUE CROSS/BLUE SHIELD | Admitting: Rehabilitative and Restorative Service Providers"

## 2017-11-29 ENCOUNTER — Encounter: Payer: Self-pay | Admitting: Rehabilitative and Restorative Service Providers"

## 2017-11-29 DIAGNOSIS — M5416 Radiculopathy, lumbar region: Secondary | ICD-10-CM

## 2017-11-29 DIAGNOSIS — R293 Abnormal posture: Secondary | ICD-10-CM | POA: Diagnosis not present

## 2017-11-29 DIAGNOSIS — R29898 Other symptoms and signs involving the musculoskeletal system: Secondary | ICD-10-CM | POA: Diagnosis not present

## 2017-11-29 NOTE — Patient Instructions (Signed)
Abdominal Bracing With Pelvic Floor (Hook-Lying)    With neutral spine, tighten pelvic floor and abdominals sucking belly button to back bone; tighten muscles in low back at waist. Hold 10 sec  Repeat __10_ times. Do __several_ times a day. Progress to do this in sitting; standing; walking and with functional activities   Trunk: Prone Extension (Press-Ups)    Lie on stomach on firm, flat surface. Relax bottom and legs. Raise chest in air with elbows straight. Keep hips flat on surface, sag stomach. Hold _2-3___ seconds. Repeat _10__ times. Do _2-3___ sessions per day. CAUTION: Movement should be gentle and slow. No pain   Trunk Extension    Standing, place back of open hands on low back. Straighten spine then arch the back and move shoulders back. Repeat __1-2__ times per session. Do __several__ sessions per day   HIP: Hamstrings - Supine  Place strap around foot. Raise leg up, keeping knee straight.  Bend opposite knee to protect back if indicated. Hold 30 seconds. 3 reps per set, 2-3 sets per day  Outer Hip Stretch: Reclined IT Band Stretch (Strap)   Strap around one foot, pull leg across body until you feel a pull or stretch in the outside of your hip, with shoulders on mat. Hold for 30 seconds. Repeat 3 times each leg. 2-3 times/day.  Piriformis Stretch   Lying on back, pull right knee toward opposite shoulder. Hold 30 seconds. Repeat 3 times. Do 2-3 sessions per day.   Quads / HF, Prone KNEE: Quadriceps - Prone    Place strap around ankle. Bring ankle toward buttocks. Press hip into surface. Hold 30 seconds. Repeat 3 times per session. Do 2-3 sessions per day.   Sleeping on Back  Place pillow under knees. A pillow with cervical support and a roll around waist are also helpful. Copyright  VHI. All rights reserved.  Sleeping on Side Place pillow between knees. Use cervical support under neck and a roll around waist as needed. Copyright  VHI. All rights  reserved.   Sleeping on Stomach   If this is the only desirable sleeping position, place pillow under lower legs, and under stomach or chest as needed.  Posture - Sitting   Sit upright, head facing forward. Try using a roll to support lower back. Keep shoulders relaxed, and avoid rounded back. Keep hips level with knees. Avoid crossing legs for long periods. Stand to Sit / Sit to Stand   To sit: Bend knees to lower self onto front edge of chair, then scoot back on seat. To stand: Reverse sequence by placing one foot forward, and scoot to front of seat. Use rocking motion to stand up.   Work Height and Reach  Ideal work height is no more than 2 to 4 inches below elbow level when standing, and at elbow level when sitting. Reaching should be limited to arm's length, with elbows slightly bent.  Bending  Bend at hips and knees, not back. Keep feet shoulder-width apart.    Posture - Standing   Good posture is important. Avoid slouching and forward head thrust. Maintain curve in low back and align ears over shoul- ders, hips over ankles.  Alternating Positions   Alternate tasks and change positions frequently to reduce fatigue and muscle tension. Take rest breaks. Computer Work   Position work to Art gallery manager. Use proper work and seat height. Keep shoulders back and down, wrists straight, and elbows at right angles. Use chair that provides full back support. Add footrest  and lumbar roll as needed.  Getting Into / Out of Car  Lower self onto seat, scoot back, then bring in one leg at a time. Reverse sequence to get out.  Dressing  Lie on back to pull socks or slacks over feet, or sit and bend leg while keeping back straight.    Housework - Sink  Place one foot on ledge of cabinet under sink when standing at sink for prolonged periods.   Pushing / Pulling  Pushing is preferable to pulling. Keep back in proper alignment, and use leg muscles to do the work.  Deep  Squat   Squat and lift with both arms held against upper trunk. Tighten stomach muscles without holding breath. Use smooth movements to avoid jerking.  Avoid Twisting   Avoid twisting or bending back. Pivot around using foot movements, and bend at knees if needed when reaching for articles.  Carrying Luggage   Distribute weight evenly on both sides. Use a cart whenever possible. Do not twist trunk. Move body as a unit.   Lifting Principles .Maintain proper posture and head alignment. .Slide object as close as possible before lifting. .Move obstacles out of the way. .Test before lifting; ask for help if too heavy. .Tighten stomach muscles without holding breath. .Use smooth movements; do not jerk. .Use legs to do the work, and pivot with feet. .Distribute the work load symmetrically and close to the center of trunk. .Push instead of pull whenever possible.   Ask For Help   Ask for help and delegate to others when possible. Coordinate your movements when lifting together, and maintain the low back curve.  Log Roll   Lying on back, bend left knee and place left arm across chest. Roll all in one movement to the right. Reverse to roll to the left. Always move as one unit. Housework - Sweeping  Use long-handled equipment to avoid stooping.   Housework - Wiping  Position yourself as close as possible to reach work surface. Avoid straining your back.  Laundry - Unloading Wash   To unload small items at bottom of washer, lift leg opposite to arm being used to reach.  Gardening - Raking  Move close to area to be raked. Use arm movements to do the work. Keep back straight and avoid twisting.     Cart  When reaching into cart with one arm, lift opposite leg to keep back straight.   Getting Into / Out of Bed  Lower self to lie down on one side by raising legs and lowering head at the same time. Use arms to assist moving without twisting. Bend both knees to roll onto  back if desired. To sit up, start from lying on side, and use same move-ments in reverse. Housework - Vacuuming  Hold the vacuum with arm held at side. Step back and forth to move it, keeping head up. Avoid twisting.   Laundry - Armed forces training and education officer so that bending and twisting can be avoided.   Laundry - Unloading Dryer  Squat down to reach into clothes dryer or use a reacher.  Gardening - Weeding / Psychiatric nurse or Kneel. Knee pads may be helpful.

## 2017-11-29 NOTE — Therapy (Signed)
[facility] [place] [address] [place], [place], [address] Phone: [phone]   Fax:  [phone]  Physical Therapy Evaluation  Patient Details  Name: Shane Blackburn MRN: [id number] Date of Birth: 11-04-1970 Referring Provider: Dr Clementeen Graham    Encounter Date: 11/29/2017  PT End of Session - 11/29/17 1004    Visit Number  1    Number of Visits  12    Date for PT Re-Evaluation  01/10/18    PT Start Time  0713    PT Stop Time  0802    PT Time Calculation (min)  49 min    Activity Tolerance  Patient tolerated treatment well       Past Medical History:  Diagnosis Date  . Benign essential HTN 03/03/2015  . Depression   . Dyslipidemia   . GERD (gastroesophageal reflux disease)   . Hypertension   . Hyperthyroidism    now hypothyroidism  . OSA (obstructive sleep apnea) 03/03/2015    Past Surgical History:  Procedure Laterality Date  . FINGER SURGERY  2010   finger tip  . WISDOM TOOTH EXTRACTION  1996    There were no vitals filed for this visit.   Subjective Assessment - 11/29/17 0717    Subjective  Patient reports that he experienced HNP 1/19 with improvement in symptoms since that time. He was working out in [employer] and noticed onset of severe back and Lt LE pain 6/19. He reports numbness in the anterior thigh on both legs - Lt to ankle/Rt to knee. Symptoms are intermittent in intensity.     Pertinent History  History of LBP for yrs- since HS no surgery, lots of chiropractic treatment over the yrs     Diagnostic tests  xray MRI - HNP L3/4/5/S1    Patient Stated Goals  reduce pain in both legs; prevent recurrent problems - core strengthening     Currently in Pain?  Yes    Pain Score  5     Pain Location  Leg    Pain Orientation  Right;Left;Anterior    Pain Descriptors / Indicators  Tingling;Burning   burning Rt/tingling Lt    Pain Type  Acute pain    Pain Radiating Towards  into Rt anterior thigh upper thigh to  side of thigh; Lt to just below the knee anteriorly     Pain Onset  More than a month ago    Pain Frequency  Constant    Aggravating Factors   standing; walking    Pain Relieving Factors  sitting; lying down          [facility] PT Assessment - 11/29/17 0001      Assessment   Medical Diagnosis  Lumbar radiculopathy     Referring Provider  Dr Clementeen Graham     Onset Date/Surgical Date  04/01/17    Hand Dominance  Right    Next MD Visit  01/05/18   scheduled for epidural 12/15/17   Prior Therapy  chiropractic care       Precautions   Precautions  None      Restrictions   Weight Bearing Restrictions  No      Balance Screen   Has the patient fallen in the past 6 months  No    Has the patient had a decrease in activity level because of a fear of falling?   No    Is the patient reluctant to leave their home because of a fear of falling?  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 Stands with forward flexed posture; head forward; shoulders rounded      AROM   Overall AROM Comments  significant tightness bilat hips Lt > Rt in all planes     AROM Assessment Site  --   some LB discomfort following trunk ROM assessment    Lumbar Flexion  70% pull upper lumbar centeral     Lumbar Extension  50% discomfort upper lumbar central     Lumbar - Right Side Bend  70%    Lumbar - Left Side Bend  70%    Lumbar - Right Rotation  50%    Lumbar - Left Rotation  50%       Strength   Overall Strength Comments  5/5 bilat       Flexibility   Hamstrings  tight Rt 76 deg; Lt 63 deg    Quadriceps  Rt 65 deg; Lt 58 deg     ITB  tight bilat     Piriformis  tight Lt > Rt       Palpation   Spinal mobility  hypomobile and painful with CPA mobs L2/3/4/5/S1    Palpation comment  muscular tightness lumbar to posterior hip musculature Lt > Rt                 Objective measurements completed on examination: See above findings.      [facility] Adult PT Treatment/Exercise - 11/29/17 0001      Self-Care   Self-Care  --   initiated spine care education      Lumbar Exercises: Stretches   Passive Hamstring Stretch  Right;Left;2 reps;30 seconds   supine with strap    Press Ups  --   1-2 sec hold x 10 to pt tolerance - no pain    Quad Stretch  Right;Left;2 reps;30 seconds   prone with strap    ITB Stretch  Right;Left;2 reps;30 seconds   supine with strap    Piriformis Stretch  Right;Left;2 reps;30 seconds   supine travell - pt using strap      Lumbar Exercises: Supine   AB Set Limitations  3 part core 10 sec x 10 verbal and tactile cues for correct technique              PT Education - 11/29/17 0759    Education Details  HEP back care     Person(s) Educated  Patient    Methods  Explanation;Demonstration;Tactile cues;Verbal cues;Handout    Comprehension  Verbalized understanding;Returned demonstration;Verbal cues required;Tactile cues required          PT Long Term Goals - 11/29/17 1011      PT LONG TERM GOAL #1   Title  Decrease LE radicular symptoms by 50-75% with patient reporting intermittent LE symptoms decreased in frequency, intensity and  duration 01/10/18    Time  6    Period  Weeks    Status  New      PT LONG TERM GOAL #2   Title  Increase trunk and LE mobility and ROM to WFL's throughout and painfree 01/10/18    Time  6    Period  Weeks    Status  New      PT LONG TERM GOAL #3   Title  Improve core stability and strength  allowing patient to participate in 20-30 min of lumbar stabilization exercises without pain 102/3/19    Time  6    Period  Weeks    Status  New      PT LONG TERM GOAL #4   Title  Patient to demonstrate and verbalize correct ergonomics and body mechanics for functional activities at work and home 01/10/18    Time  6    Period  Weeks    Status  New      PT LONG TERM GOAL #5   Title  Independent in HEP 01/10/18    Time  6    Period  Weeks    Status  New      PT LONG TERM GOAL #6   Title  Improve FOTO to </= 36% limitation 01/10/18    Time  6    Period  Weeks    Status  New             Plan - 11/29/17 1005    Clinical Impression Statement  Patient presents with history of chronic recurrent LBP with bilat LE radicular symptoms. Recent flare up was 1/19 with significant increase in symptoms in the past few weeks. Patient has poor posture and alignment - especially in sitting; limited trunk and LE ROM/mobility; significant muscular tightness through the hips and LE's; pain with CPA mobs through the lumbar spine; limited tolerance for functional activities. Patient will benefot from PT to address problems identified.     History and Personal Factors relevant to plan of care:  Chronic LBP and intermittent LE symptoms since high school; choropractic manipulations for many years; sitting 40+ hours/week at work; MRI shows HNP multiple levels lumbar spine     Clinical Presentation  Evolving    Clinical Decision Making  Low    Rehab Potential  Good    Clinical Impairments Affecting Rehab Potential  chronic nature of complaints     PT Frequency  2x / week    PT Duration  6 weeks    PT Treatment/Interventions  Patient/family education;ADLs/Self Care Home Management;Cryotherapy;Electrical Stimulation;Iontophoresis 4mg /ml Dexamethasone;Moist Heat;Ultrasound;Dry needling;Manual techniques;Neuromuscular re-education;Functional mobility training;Therapeutic activities;Therapeutic exercise    PT  Next Visit Plan  review HEP; progress with stretching and strengthening; core stabilization; back care education; ergonomic suggestions for sitting at work     [employer] and Agree with Plan of Care  Patient       Patient will benefit from skilled therapeutic intervention in order to improve the following deficits and impairments:  Postural dysfunction, Improper body mechanics, Pain, Increased fascial restricitons, Increased muscle spasms, Decreased mobility, Decreased range of motion, Decreased strength, Decreased activity tolerance  Visit Diagnosis: Radiculopathy, lumbar region - Plan: PT plan of care cert/re-cert  Other symptoms and signs involving the musculoskeletal system - Plan: PT plan of care cert/re-cert  Abnormal posture - Plan: PT plan of care cert/re-cert     Problem List Patient Active Problem List   Diagnosis Date Noted  . OSA (obstructive sleep apnea)  03/03/2015  . Benign essential HTN 03/03/2015  . HYPOTHYROIDISM 02/14/2009  . HYPERLIPIDEMIA 02/14/2009  . DEPRESSION 02/14/2009  . ACUTE PHARYNGITIS 02/14/2009  . GERD 02/14/2009  . TRAUMATIC AMP OTH FINGER WITHOUT MENTION COMP 07/09/2008    Shane Blackburn PT, MPH 11/29/2017, 10:17 AM  [facility] [address] [place], [place], [address] Phone: [phone]   Fax:  [phone]  Name: Shane Blackburn MRN: [id number] Date of Birth: Aug 07, 1970

## 2017-12-04 ENCOUNTER — Ambulatory Visit (INDEPENDENT_AMBULATORY_CARE_PROVIDER_SITE_OTHER): Payer: BLUE CROSS/BLUE SHIELD | Admitting: Physical Therapy

## 2017-12-04 DIAGNOSIS — M5416 Radiculopathy, lumbar region: Secondary | ICD-10-CM | POA: Diagnosis not present

## 2017-12-04 DIAGNOSIS — R29898 Other symptoms and signs involving the musculoskeletal system: Secondary | ICD-10-CM | POA: Diagnosis not present

## 2017-12-04 DIAGNOSIS — R293 Abnormal posture: Secondary | ICD-10-CM | POA: Diagnosis not present

## 2017-12-04 NOTE — Therapy (Signed)
[facility] [place] [address] [place], [place], [address] Phone: [phone]   Fax:  [phone]  Physical Therapy Treatment  Patient Details  Name: Shane Blackburn MRN: [id number] Date of Birth: 1970/05/19 Referring Provider: Dr. Denyse Amass   Encounter Date: 12/04/2017  PT End of Session - 12/04/17 0722    Visit Number  2    Number of Visits  12    Date for PT Re-Evaluation  01/10/18    PT Start Time  0718    PT Stop Time  0818    PT Time Calculation (min)  60 min    Activity Tolerance  Patient tolerated treatment well    Behavior During Therapy  [facility] for tasks assessed/performed       Past Medical History:  Diagnosis Date  . Benign essential HTN 03/03/2015  . Depression   . Dyslipidemia   . GERD (gastroesophageal reflux disease)   . Hypertension   . Hyperthyroidism    now hypothyroidism  . OSA (obstructive sleep apnea) 03/03/2015    Past Surgical History:  Procedure Laterality Date  . FINGER SURGERY  2010   finger tip  . WISDOM TOOTH EXTRACTION  1996    There were no vitals filed for this visit.  Subjective Assessment - 12/04/17 0722    Subjective  Pt reports he feels a little worse since starting the stretches.  He's hopeful this will turn around soon.       Patient Stated Goals  reduce pain in both legs; prevent recurrent problems - core strengthening     Currently in Pain?  Yes    Pain Score  5     Pain Location  Leg    Pain Orientation  Left;Right;Anterior    Pain Descriptors / Indicators  Sharp;Dull    Pain Radiating Towards  to knees    Aggravating Factors   standing still     Pain Relieving Factors  lying down         [facility] PT Assessment - 12/04/17 0001      Assessment   Medical Diagnosis  Lumbar radiculopathy     Referring Provider  Dr. Denyse Amass    Onset Date/Surgical Date  04/01/17    Hand Dominance  Right    Next MD Visit  01/05/18   scheduled for epidural 12/15/17   Prior Therapy   chiropractic care       Flexibility   Quadriceps  Lt 110 deg. Rt 114 deg        [facility] Adult PT Treatment/Exercise - 12/04/17 0001      Self-Care   Self-Care  Other Self-Care Comments    Other Self-Care Comments   educated pt on self massage to hips and low back to decrease fascial tightness,  and educated pt regarding lumbar support in car/ work chair to support good lumbar curve. Pt verbalized understanding.       Lumbar Exercises: Stretches   Passive Hamstring Stretch  Right;Left;2 reps;30 seconds   supine with strap    Standing Extension  2 reps   3 seconds   Prone on Elbows Stretch  --   8 reps, 3 sec hold   Press Ups  --   1 rep -switched to POE   Quad Stretch  Right;Left;2 reps;30 seconds   prone with strap    ITB Stretch  Right;Left;30 seconds;1 rep   supine with strap    Piriformis Stretch  Right;Left;2 reps;20 seconds      Lumbar Exercises: Aerobic  Stationary Bike  L2: 5 min    PTA present to discuss progress and monitor.     Lumbar Exercises: Seated   Sit to Stand  5 reps   with core engaged.    Other Seated Lumbar Exercises  TA engaged with lap press x 5 sec, 5 reps.       Lumbar Exercises: Supine   Ab Set  5 reps;5 seconds   cues given for technique     Modalities   Modalities  Electrical Stimulation;Cryotherapy;Moist Heat      Moist Heat Therapy   Number Minutes Moist Heat  15 Minutes    Moist Heat Location  --   Lt thigh     Cryotherapy   Number Minutes Cryotherapy  15 Minutes    Cryotherapy Location  Lumbar Spine   and buttocks   Type of Cryotherapy  Ice pack      Electrical Stimulation   Electrical Stimulation Location  Lt thigh/  Lt piriformis    Electrical Stimulation Action  premod to each area    Electrical Stimulation Parameters  to pt tolerance    Electrical Stimulation Goals  Pain;Tone             PT Education - 12/04/17 0810    Education Details  dry needling info.  discussed lumbar support for car/chair    Person(s)  Educated  Patient    Methods  Explanation;Handout    Comprehension  Verbalized understanding          PT Long Term Goals - 12/04/17 0734      PT LONG TERM GOAL #1   Title  Decrease LE radicular symptoms by 50-75% with patient reporting intermittent LE symptoms decreased in frequency, intensity and duration 01/10/18    Time  6    Period  Weeks    Status  On-going      PT LONG TERM GOAL #2   Title  Increase trunk and LE mobility and ROM to WFL's throughout and painfree 01/10/18    Time  6    Period  Weeks    Status  On-going      PT LONG TERM GOAL #3   Title  Improve core stability and strength allowing patient to participate in 20-30 min of lumbar stabilization exercises without pain 102/3/19    Time  6    Period  Weeks    Status  On-going      PT LONG TERM GOAL #4   Title  Patient to demonstrate and verbalize correct ergonomics and body mechanics for functional activities at work and home 01/10/18    Time  6    Period  Weeks    Status  On-going      PT LONG TERM GOAL #5   Title  Independent in HEP 01/10/18    Time  6    Period  Weeks    Status  On-going      PT LONG TERM GOAL #6   Title  Improve FOTO to </= 36% limitation 01/10/18    Time  6    Period  Weeks    Status  On-going            Plan - 12/04/17 0737    Clinical Impression Statement  Pt demonstrated improved quad flexibility since last visit.  He was unable to tolerate prone press ups, so this was modified.  He reported some increased in pain with LLE stretches, especially piriformis and ITB.  Pt will benefit  from continued PT intervention to max functional mobility with less pain.      Rehab Potential  Good    PT Frequency  2x / week    PT Duration  6 weeks    PT Treatment/Interventions  Patient/family education;ADLs/Self Care Home Management;Cryotherapy;Electrical Stimulation;Iontophoresis 4mg /ml Dexamethasone;Moist Heat;Ultrasound;Dry needling;Manual techniques;Neuromuscular  re-education;Functional mobility training;Therapeutic activities;Therapeutic exercise    PT Next Visit Plan  progress with stretching and strengthening; core stabilization; back care education. manual therapy/DN.  assess pelvis alignment in future session.     Consulted and Agree with Plan of Care  Patient       Patient will benefit from skilled therapeutic intervention in order to improve the following deficits and impairments:  Postural dysfunction, Improper body mechanics, Pain, Increased fascial restricitons, Increased muscle spasms, Decreased mobility, Decreased range of motion, Decreased strength, Decreased activity tolerance  Visit Diagnosis: Radiculopathy, lumbar region  Other symptoms and signs involving the musculoskeletal system  Abnormal posture     Problem List Patient Active Problem List   Diagnosis Date Noted  . OSA (obstructive sleep apnea) 03/03/2015  . Benign essential HTN 03/03/2015  . HYPOTHYROIDISM 02/14/2009  . HYPERLIPIDEMIA 02/14/2009  . DEPRESSION 02/14/2009  . ACUTE PHARYNGITIS 02/14/2009  . GERD 02/14/2009  . TRAUMATIC AMP OTH FINGER WITHOUT MENTION COMP 07/09/2008   Mayer CamelJennifer Carlson-Long, PTA 12/04/17 8:27 AM  [facility] [place] [address] [place], [place], [address] Phone: [phone]   Fax:  [phone]  Name: Shane Blackburn MRN: [id number] Date of Birth: [birth year]

## 2017-12-04 NOTE — Patient Instructions (Signed)

## 2017-12-06 ENCOUNTER — Ambulatory Visit: Payer: BLUE CROSS/BLUE SHIELD | Admitting: Rehabilitative and Restorative Service Providers"

## 2017-12-06 ENCOUNTER — Encounter: Payer: Self-pay | Admitting: Rehabilitative and Restorative Service Providers"

## 2017-12-06 DIAGNOSIS — R293 Abnormal posture: Secondary | ICD-10-CM

## 2017-12-06 DIAGNOSIS — M5416 Radiculopathy, lumbar region: Secondary | ICD-10-CM

## 2017-12-06 DIAGNOSIS — R29898 Other symptoms and signs involving the musculoskeletal system: Secondary | ICD-10-CM | POA: Diagnosis not present

## 2017-12-06 NOTE — Patient Instructions (Signed)
Scapula Adduction With Pectoralis Stretch: Low - Standing   Shoulders at 45 hands even with shoulders, keeping weight through legs, shift weight forward until you feel pull or stretch through the front of your chest. Hold _30__ seconds. Do _3__ times, _2-4__ times per day.   Scapula Adduction With Pectoralis Stretch: Mid-Range - Standing   Shoulders at 90 elbows even with shoulders, keeping weight through legs, shift weight forward until you feel pull or strength through the front of your chest. Hold __30_ seconds. Do _3__ times, __2-4_ times per day.   Scapula Adduction With Pectoralis Stretch: High - Standing   Shoulders at 120 hands up high on the doorway, keeping weight on feet, shift weight forward until you feel pull or stretch through the front of your chest. Hold _30__ seconds. Do _3__ times, _2-3__ times per day.  Trigger Point Dry Needling  . What is Trigger Point Dry Needling (DN)? o DN is a physical therapy technique used to treat muscle pain and dysfunction. Specifically, DN helps deactivate muscle trigger points (muscle knots).  o A thin filiform needle is used to penetrate the skin and stimulate the underlying trigger point. The goal is for a local twitch response (LTR) to occur and for the trigger point to relax. No medication of any kind is injected during the procedure.   . What Does Trigger Point Dry Needling Feel Like?  o The procedure feels different for each individual patient. Some patients report that they do not actually feel the needle enter the skin and overall the process is not painful. Very mild bleeding may occur. However, many patients feel a deep cramping in the muscle in which the needle was inserted. This is the local twitch response.   . How Will I feel after the treatment? o Soreness is normal, and the onset of soreness may not occur for a few hours. Typically this soreness does not last longer than two days.  o Bruising is uncommon, however; ice  can be used to decrease any possible bruising.  o In rare cases feeling tired or nauseous after the treatment is normal. In addition, your symptoms may get worse before they get better, this period will typically not last longer than 24 hours.   . What Can I do After My Treatment? o Increase your hydration by drinking more water for the next 24 hours. o You may place ice or heat on the areas treated that have become sore, however, do not use heat on inflamed or bruised areas. Heat often brings more relief post needling. o You can continue your regular activities, but vigorous activity is not recommended initially after the treatment for 24 hours. o DN is best combined with other physical therapy such as strengthening, stretching, and other therapies.   

## 2017-12-06 NOTE — Therapy (Signed)
[facility] [place] [address] [place], [place], [address] Phone: [phone]   Fax:  [phone]  Physical Therapy Treatment  Patient Details  Name: Shane Blackburn MRN: [id number] Date of Birth: [birth year] Referring Provider: Dr. Denyse Amass   Encounter Date: 12/06/2017  PT End of Session - 12/06/17 0714    Visit Number  3    Number of Visits  12    Date for PT Re-Evaluation  01/10/18    PT Start Time  0714    PT Stop Time  0815    PT Time Calculation (min)  61 min    Activity Tolerance  Patient tolerated treatment well       Past Medical History:  Diagnosis Date  . Benign essential HTN 03/03/2015  . Depression   . Dyslipidemia   . GERD (gastroesophageal reflux disease)   . Hypertension   . Hyperthyroidism    now hypothyroidism  . OSA (obstructive sleep apnea) 03/03/2015    Past Surgical History:  Procedure Laterality Date  . FINGER SURGERY  2010   finger tip  . WISDOM TOOTH EXTRACTION  1996    There were no vitals filed for this visit.  Subjective Assessment - 12/06/17 0715    Subjective  Patient reports that he has some soreness from the exercises. He can feel the abs and core working. The Lt LE symptoms (tingling and weak feeling) seem to be reduced.     Currently in Pain?  Yes    Pain Score  4     Pain Location  Leg    Pain Orientation  Left;Right;Anterior    Pain Descriptors / Indicators  Dull    Pain Type  Acute pain    Pain Onset  More than a month ago    Pain Frequency  Intermittent    Aggravating Factors   standing     Pain Relieving Factors  lying down                        [facility] Adult PT Treatment/Exercise - 12/06/17 0001      Neuro Re-ed    Neuro Re-ed Details   working on standing and transitional movements with neutral lumar spine       Exercises   Exercises  Lumbar      Lumbar Exercises: Stretches   Other Lumbar Stretch Exercise  3 way doorway stretch 30 sec x 2 each  position       Lumbar Exercises: Aerobic   Nustep  L5 UE(10) x6 min       Lumbar Exercises: Seated   Sit to Stand  10 reps   with core engaged. keeping neutral spine(avoiding post tilt)   Other Seated Lumbar Exercises  reaching overhead altermating arms x 10 each with core engaged and neutral spine(avoiding posterior pelvic tilt)     Other Seated Lumbar Exercises  segmental mobility lumbar spine in sitting rolling into and out of anterior pelvic tilt to posterior pelvic tilt       Lumbar Exercises: Supine   AB Set Limitations  3 part core 10 sec x 10       Moist Heat Therapy   Number Minutes Moist Heat  20 Minutes    Moist Heat Location  Lumbar Spine      Electrical Stimulation   Electrical Stimulation Location  bilat lumbar paraspinals to QL    Electrical Stimulation Action  IFC    Electrical Stimulation Parameters  to  tolerance    Electrical Stimulation Goals  Pain;Tone      Manual Therapy   Manual therapy comments  pt prone     Soft tissue mobilization  deep tissue work through the lumbar paraspinals and QL Lt > Rt     Myofascial Release  bilat lumbar        Trigger Point Dry Needling - 12/06/17 0800    Consent Given?  Yes    Education Handout Provided  Yes    Muscles Treated Lower Body  --   bilat lumbar paraspinals/QL dec palpable tightness           PT Education - 12/06/17 0743    Education Details  HEP DN    Person(s) Educated  Patient    Methods  Explanation;Demonstration;Tactile cues;Verbal cues;Handout    Comprehension  Verbalized understanding;Returned demonstration;Verbal cues required;Tactile cues required          PT Long Term Goals - 12/04/17 0734      PT LONG TERM GOAL #1   Title  Decrease LE radicular symptoms by 50-75% with patient reporting intermittent LE symptoms decreased in frequency, intensity and duration 01/10/18    Time  6    Period  Weeks    Status  On-going      PT LONG TERM GOAL #2   Title  Increase trunk and LE mobility and  ROM to WFL's throughout and painfree 01/10/18    Time  6    Period  Weeks    Status  On-going      PT LONG TERM GOAL #3   Title  Improve core stability and strength allowing patient to participate in 20-30 min of lumbar stabilization exercises without pain 102/3/19    Time  6    Period  Weeks    Status  On-going      PT LONG TERM GOAL #4   Title  Patient to demonstrate and verbalize correct ergonomics and body mechanics for functional activities at work and home 01/10/18    Time  6    Period  Weeks    Status  On-going      PT LONG TERM GOAL #5   Title  Independent in HEP 01/10/18    Time  6    Period  Weeks    Status  On-going      PT LONG TERM GOAL #6   Title  Improve FOTO to </= 36% limitation 01/10/18    Time  6    Period  Weeks    Status  On-going            Plan - 12/06/17 0801    Clinical Impression Statement  Good response to work through the lumbar spine including work on segmental mobility; manual work; DN; neuromuscular re-ed with transitional movement. Added stretch for pecs to improve posture.     Rehab Potential  Good    PT Frequency  2x / week    PT Duration  6 weeks    PT Treatment/Interventions  Patient/family education;ADLs/Self Care Home Management;Cryotherapy;Electrical Stimulation;Iontophoresis 4mg /ml Dexamethasone;Moist Heat;Ultrasound;Dry needling;Manual techniques;Neuromuscular re-education;Functional mobility training;Therapeutic activities;Therapeutic exercise    PT Next Visit Plan  progress with stretching and strengthening; core stabilization; back care education. assess response to manual therapy/DN.  assess pelvis alignment in future session.     Consulted and Agree with Plan of Care  Patient       Patient will benefit from skilled therapeutic intervention in order to improve the following deficits and impairments:  Postural dysfunction, Improper body mechanics, Pain, Increased fascial restricitons, Increased muscle spasms, Decreased  mobility, Decreased range of motion, Decreased strength, Decreased activity tolerance  Visit Diagnosis: Radiculopathy, lumbar region  Other symptoms and signs involving the musculoskeletal system  Abnormal posture     Problem List Patient Active Problem List   Diagnosis Date Noted  . OSA (obstructive sleep apnea) 03/03/2015  . Benign essential HTN 03/03/2015  . HYPOTHYROIDISM 02/14/2009  . HYPERLIPIDEMIA 02/14/2009  . DEPRESSION 02/14/2009  . ACUTE PHARYNGITIS 02/14/2009  . GERD 02/14/2009  . TRAUMATIC AMP OTH FINGER WITHOUT MENTION COMP 07/09/2008    Green Quincy Rober Minion PT, MPH 12/06/2017, 8:03 AM  [facility] [address] [place], [place], [address] Phone: [phone]   Fax:  [phone]  Name: Shane Blackburn MRN: [id number] Date of Birth: 09/11/1970

## 2017-12-11 ENCOUNTER — Encounter: Payer: BLUE CROSS/BLUE SHIELD | Admitting: Rehabilitative and Restorative Service Providers"

## 2017-12-13 ENCOUNTER — Encounter: Payer: BLUE CROSS/BLUE SHIELD | Admitting: Physical Therapy

## 2017-12-15 ENCOUNTER — Ambulatory Visit
Admission: RE | Admit: 2017-12-15 | Discharge: 2017-12-15 | Disposition: A | Discharge status: Home / Self Care | Payer: BLUE CROSS/BLUE SHIELD | Source: Ambulatory Visit | Attending: Family Medicine | Admitting: Family Medicine

## 2017-12-15 DIAGNOSIS — M5126 Other intervertebral disc displacement, lumbar region: Secondary | ICD-10-CM | POA: Diagnosis not present

## 2017-12-15 MED ORDER — METHYLPREDNISOLONE ACETATE 40 MG/ML INJ SUSP (RADIOLOG
120.0000 mg | Freq: Once | INTRAMUSCULAR | Status: AC
  Administered 2017-12-15: 120 mg via EPIDURAL

## 2017-12-15 MED ORDER — IOPAMIDOL (ISOVUE-M 200) INJECTION 41%
1.0000 mL | Freq: Once | INTRAMUSCULAR | Status: AC
  Administered 2017-12-15: 1 mL via EPIDURAL

## 2017-12-15 NOTE — Discharge Instructions (Signed)

## 2017-12-18 ENCOUNTER — Ambulatory Visit: Payer: BLUE CROSS/BLUE SHIELD | Admitting: Physical Therapy

## 2017-12-18 DIAGNOSIS — M5416 Radiculopathy, lumbar region: Secondary | ICD-10-CM | POA: Diagnosis not present

## 2017-12-18 DIAGNOSIS — R29898 Other symptoms and signs involving the musculoskeletal system: Secondary | ICD-10-CM | POA: Diagnosis not present

## 2017-12-18 DIAGNOSIS — R293 Abnormal posture: Secondary | ICD-10-CM | POA: Diagnosis not present

## 2017-12-18 NOTE — Therapy (Signed)
[place], [place], [address] Phone: [phone]   Fax:  [phone]  Physical Therapy Treatment  Patient Details  Name: Shane Blackburn MRN: [id number] Date of Birth: [birth year] Referring Provider (PT): Dr. Georgina Snell   Encounter Date: 12/18/2017  PT End of Session - 12/18/17 0714    Visit Number  4    Number of Visits  12    Date for PT Re-Evaluation  01/10/18    PT Start Time  0716    PT Stop Time  0811    PT Time Calculation (min)  55 min       Past Medical History:  Diagnosis Date  . Benign essential HTN 03/03/2015  . Depression   . Dyslipidemia   . GERD (gastroesophageal reflux disease)   . Hypertension   . Hyperthyroidism    now hypothyroidism  . OSA (obstructive sleep apnea) 03/03/2015    Past Surgical History:  Procedure Laterality Date  . FINGER SURGERY  2010   finger tip  . WISDOM TOOTH EXTRACTION  1996    There were no vitals filed for this visit.  Subjective Assessment - 12/18/17 0719    Subjective  "I feel I had some relief from dry needling".  Pt reports he had an injection in his back on Friday; hasn't noticed a big difference yet.  He has walked the dogs 1 mile each day this weekend.      Patient Stated Goals  reduce pain in both legs; prevent recurrent problems - core strengthening     Currently in Pain?  Yes    Pain Score  6     Pain Location  Leg    Pain Orientation  Left;Anterior;Proximal    Aggravating Factors   putting pressure on thigh in sitting; walking     Pain Relieving Factors  lying down, sitting down resting         [facility] PT Assessment - 12/18/17 0001      Assessment   Medical Diagnosis  Lumbar radiculopathy     Referring Provider (PT)  Dr. Georgina Snell    Onset Date/Surgical Date  04/01/17    Hand Dominance  Right    Next MD Visit  01/05/18   scheduled for epidural 12/15/17   Prior Therapy  chiropractic care       Flexibility   Hamstrings  Rt:  63  deg     Quadriceps  Lt: 104 deg; Rt 100 deg      Palpation   SI assessment   Rt sacral torsion; Rt PSIS higher than Lt; Rt ASIS lower than Lt. (point tender in Lt PSIS)       [facility] Adult PT Treatment/Exercise - 12/18/17 0001      Exercises   Exercises  Lumbar      Lumbar Exercises: Stretches   Passive Hamstring Stretch  Right;Left;2 reps;60 seconds    Prone on Elbows Stretch  3 reps;10 seconds    Quad Stretch  Right;Left;2 reps;30 seconds   prone with strap    Other Lumbar Stretch Exercise  3 way doorway stretch 30 sec x 1 each position       Lumbar Exercises: Aerobic   Tread Mill  2.0 mph x 4 min       Lumbar Exercises: Supine   Bridge  10 reps;5 seconds   with ball squeeze     Lumbar Exercises: Prone   Opposite Arm/Leg Raise  Right arm/Left leg;Left arm/Right leg;5 reps;2 seconds  Moist Heat Therapy   Number Minutes Moist Heat  20 Minutes    Moist Heat Location  Lumbar Spine   and Rt thigh     Electrical Stimulation   Electrical Stimulation Location  bilat lumbar paraspinals to QL    Electrical Stimulation Action  IFC    Electrical Stimulation Parameters  to tolerance    Electrical Stimulation Goals  Pain;Tone      Manual Therapy   Manual Therapy  Muscle Energy Technique    Myofascial Release  to Rt ant prox quad    Muscle Energy Technique  MET to correct Rt ant rotated ilium (in supine); MET to correct Rt sacral torsion (in prone)                   PT Long Term Goals - 12/04/17 0734      PT LONG TERM GOAL #1   Title  Decrease LE radicular symptoms by 50-75% with patient reporting intermittent LE symptoms decreased in frequency, intensity and duration 01/10/18    Time  6    Period  Weeks    Status  On-going      PT LONG TERM GOAL #2   Title  Increase trunk and LE mobility and ROM to WFL's throughout and painfree 01/10/18    Time  6    Period  Weeks    Status  On-going      PT LONG TERM GOAL #3   Title  Improve core stability and strength  allowing patient to participate in 20-30 min of lumbar stabilization exercises without pain 102/3/19    Time  6    Period  Weeks    Status  On-going      PT LONG TERM GOAL #4   Title  Patient to demonstrate and verbalize correct ergonomics and body mechanics for functional activities at work and home 01/10/18    Time  6    Period  Weeks    Status  On-going      PT LONG TERM GOAL #5   Title  Independent in HEP 01/10/18    Time  6    Period  Weeks    Status  On-going      PT LONG TERM GOAL #6   Title  Improve FOTO to </= 36% limitation 01/10/18    Time  6    Period  Weeks    Status  On-going            Plan - 12/18/17 0733    Clinical Impression Statement  Pt reporting elimination of numbness in LLE.    Pt demonstrated decreased quad flexibility from last assessment; hamstrings remain tight.   some slight pelivs asymmetry noted; improved with MET corrections. Pt tolerated all exercises well, except walking on treadmill which increased symptoms in Rt thigh. Pt progressing towards goals.      Rehab Potential  Good    Clinical Impairments Affecting Rehab Potential  chronic nature of complaints     PT Frequency  2x / week    PT Duration  6 weeks    PT Treatment/Interventions  Patient/family education;ADLs/Self Care Home Management;Cryotherapy;Electrical Stimulation;Iontophoresis 59m/ml Dexamethasone;Moist Heat;Ultrasound;Dry needling;Manual techniques;Neuromuscular re-education;Functional mobility training;Therapeutic activities;Therapeutic exercise    PT Next Visit Plan  assess pelvic alignment. continue core stabilization.     Consulted and Agree with Plan of Care  Patient       Patient will benefit from skilled therapeutic intervention in order to improve the following deficits and impairments:  Postural dysfunction, Improper body mechanics, Pain, Increased fascial restricitons, Increased muscle spasms, Decreased mobility, Decreased range of motion, Decreased strength,  Decreased activity tolerance  Visit Diagnosis: Radiculopathy, lumbar region  Other symptoms and signs involving the musculoskeletal system  Abnormal posture     Problem List Patient Active Problem List   Diagnosis Date Noted  . OSA (obstructive sleep apnea) 03/03/2015  . Benign essential HTN 03/03/2015  . HYPOTHYROIDISM 02/14/2009  . HYPERLIPIDEMIA 02/14/2009  . DEPRESSION 02/14/2009  . ACUTE PHARYNGITIS 02/14/2009  . GERD 02/14/2009  . TRAUMATIC AMP OTH FINGER WITHOUT MENTION COMP 07/09/2008   Kerin Perna, PTA 12/18/17 9:48 AM  [facility] [place], [place], [address] Phone: [phone]   Fax:  [phone]  Name: Shane Blackburn MRN: [id number] Date of Birth: 04/05/1970

## 2017-12-20 ENCOUNTER — Ambulatory Visit: Payer: BLUE CROSS/BLUE SHIELD | Admitting: Rehabilitative and Restorative Service Providers"

## 2017-12-20 ENCOUNTER — Encounter: Payer: Self-pay | Admitting: Rehabilitative and Restorative Service Providers"

## 2017-12-20 DIAGNOSIS — R293 Abnormal posture: Secondary | ICD-10-CM | POA: Diagnosis not present

## 2017-12-20 DIAGNOSIS — R29898 Other symptoms and signs involving the musculoskeletal system: Secondary | ICD-10-CM

## 2017-12-20 DIAGNOSIS — M5416 Radiculopathy, lumbar region: Secondary | ICD-10-CM

## 2017-12-20 NOTE — Therapy (Addendum)
[facility] [place] [address] [place], [place], [address] Phone: [phone]   Fax:  [phone]  Physical Therapy Treatment  Patient Details  Name: Shane Blackburn MRN: [id number] Date of Birth: 1970/11/09 Referring Provider (PT): Dr. Denyse Amass   Encounter Date: 12/20/2017  PT End of Session - 12/20/17 0716    Visit Number  5    Number of Visits  12    Date for PT Re-Evaluation  01/10/18    PT Start Time  0715    PT Stop Time  0812    PT Time Calculation (min)  57 min    Activity Tolerance  Patient tolerated treatment well       Past Medical History:  Diagnosis Date  . Benign essential HTN 03/03/2015  . Depression   . Dyslipidemia   . GERD (gastroesophageal reflux disease)   . Hypertension   . Hyperthyroidism    now hypothyroidism  . OSA (obstructive sleep apnea) 03/03/2015    Past Surgical History:  Procedure Laterality Date  . FINGER SURGERY  2010   finger tip  . WISDOM TOOTH EXTRACTION  1996    There were no vitals filed for this visit.  Subjective Assessment - 12/20/17 0716    Subjective  Patient reports that he had some increase in the pain and tightness in the Rt quad. He did stretch some which did seem to help some. He continues to have tightness and discomfort.     Currently in Pain?  Yes    Pain Score  5     Pain Location  Leg    Pain Orientation  Right;Anterior    Pain Descriptors / Indicators  Tightness;Aching    Pain Type  Acute pain;Chronic pain   since 04/01/17 anterior thigh                       [facility] Adult PT Treatment/Exercise - 12/20/17 0001      Lumbar Exercises: Stretches   Hip Flexor Stretch  Right;Left;2 reps;30 seconds   seated    Quad Stretch  Right;Left;60 seconds;3 reps   prone with strap      Lumbar Exercises: Aerobic   Tread Mill  2.0 mph x 5 min       Moist Heat Therapy   Number Minutes Moist Heat  20 Minutes    Moist Heat Location  Lumbar Spine   and  Rt thigh to knee      Electrical Stimulation   Electrical Stimulation Location  anterior Rt thigh quads     Electrical Stimulation Action  IFC    Electrical Stimulation Parameters  to tolerance    Electrical Stimulation Goals  Pain;Tone      Manual Therapy   Manual therapy comments  pt supine     Soft tissue mobilization  deep tissue work through the anterior Rt thigh - quads    IASTM Rt quad    Myofascial Release  anterior thigh        Trigger Point Dry Needling - 12/20/17 0811    Consent Given?  Yes    Muscles Treated Lower Body  Quadriceps    Quadriceps Response  Palpable increased muscle length;Twitch response elicited           PT Education - 12/20/17 0751    Education Details  HEP     Person(s) Educated  Patient    Methods  Explanation;Demonstration;Tactile cues;Verbal cues;Handout    Comprehension  Verbalized understanding;Returned demonstration;Verbal cues  required;Tactile cues required          PT Long Term Goals - 12/04/17 0734      PT LONG TERM GOAL #1   Title  Decrease LE radicular symptoms by 50-75% with patient reporting intermittent LE symptoms decreased in frequency, intensity and duration 01/10/18    Time  6    Period  Weeks    Status  On-going      PT LONG TERM GOAL #2   Title  Increase trunk and LE mobility and ROM to WFL's throughout and painfree 01/10/18    Time  6    Period  Weeks    Status  On-going      PT LONG TERM GOAL #3   Title  Improve core stability and strength allowing patient to participate in 20-30 min of lumbar stabilization exercises without pain 102/3/19    Time  6    Period  Weeks    Status  On-going      PT LONG TERM GOAL #4   Title  Patient to demonstrate and verbalize correct ergonomics and body mechanics for functional activities at work and home 01/10/18    Time  6    Period  Weeks    Status  On-going      PT LONG TERM GOAL #5   Title  Independent in HEP 01/10/18    Time  6    Period  Weeks    Status   On-going      PT LONG TERM GOAL #6   Title  Improve FOTO to </= 36% limitation 01/10/18    Time  6    Period  Weeks    Status  On-going            Plan - 12/20/17 0716    Clinical Impression Statement  Patient reports most of pain, tightness and discomfort is Rt anterior thigh. Focused on this area today with DN, manual work and stretching. He reports no significant change in sympotms following ESI lumbar spine Friday 12/15/17.    Rehab Potential  Good    Clinical Impairments Affecting Rehab Potential  chronic nature of complaints     PT Frequency  2x / week    PT Duration  6 weeks    PT Treatment/Interventions  Patient/family education;ADLs/Self Care Home Management;Cryotherapy;Electrical Stimulation;Iontophoresis 4mg /ml Dexamethasone;Moist Heat;Ultrasound;Dry needling;Manual techniques;Neuromuscular re-education;Functional mobility training;Therapeutic activities;Therapeutic exercise    PT Next Visit Plan  assess pelvic alignment. continue core stabilization - assess response to DN and stretching     Consulted and Agree with Plan of Care  Patient       Patient will benefit from skilled therapeutic intervention in order to improve the following deficits and impairments:  Postural dysfunction, Improper body mechanics, Pain, Increased fascial restricitons, Increased muscle spasms, Decreased mobility, Decreased range of motion, Decreased strength, Decreased activity tolerance  Visit Diagnosis: Radiculopathy, lumbar region  Other symptoms and signs involving the musculoskeletal system  Abnormal posture     Problem List Patient Active Problem List   Diagnosis Date Noted  . OSA (obstructive sleep apnea) 03/03/2015  . Benign essential HTN 03/03/2015  . HYPOTHYROIDISM 02/14/2009  . HYPERLIPIDEMIA 02/14/2009  . DEPRESSION 02/14/2009  . ACUTE PHARYNGITIS 02/14/2009  . GERD 02/14/2009  . TRAUMATIC AMP OTH FINGER WITHOUT MENTION COMP 07/09/2008    Advika Mclelland Rober Minion PT, MPH   12/20/2017, 8:12 AM  [facility] [address] [place], [place], [address] Phone: [phone]   Fax:  [phone]  Name: Cordarious Zeek MRN: [id number] Date of Birth: [birth year]

## 2017-12-20 NOTE — Patient Instructions (Signed)
    Quads / HF, Supine   Lie near edge of bed, pull both knees up toward chest. Hold one knee as you drop the other leg off the edge of the bed.  Relax hanging knee/can bend knee back if indicated. Hold 30 seconds. Repeat 3 times per session. Do 2-3 sessions per day.  Quads / HF, Prone KNEE: Quadriceps - Prone    Place strap around ankle. Bring ankle toward buttocks. Press hip into surface. Hold 30 seconds. Repeat 3 times per session. Do 2-3 sessions per day.   .  

## 2017-12-25 ENCOUNTER — Encounter: Payer: BLUE CROSS/BLUE SHIELD | Admitting: Physical Therapy

## 2017-12-27 ENCOUNTER — Encounter: Payer: BLUE CROSS/BLUE SHIELD | Admitting: Rehabilitative and Restorative Service Providers"

## 2018-01-01 ENCOUNTER — Ambulatory Visit: Payer: BLUE CROSS/BLUE SHIELD | Admitting: Family Medicine

## 2018-01-01 ENCOUNTER — Encounter: Payer: Self-pay | Admitting: Family Medicine

## 2018-01-01 VITALS — BP 134/80 | HR 63 | Wt 167.0 lb

## 2018-01-01 DIAGNOSIS — M7061 Trochanteric bursitis, right hip: Secondary | ICD-10-CM | POA: Diagnosis not present

## 2018-01-01 DIAGNOSIS — M5416 Radiculopathy, lumbar region: Secondary | ICD-10-CM

## 2018-01-01 DIAGNOSIS — M79651 Pain in right thigh: Secondary | ICD-10-CM

## 2018-01-01 DIAGNOSIS — E039 Hypothyroidism, unspecified: Secondary | ICD-10-CM

## 2018-01-01 NOTE — Patient Instructions (Signed)
Thank you for coming in today. We will modify PT.  You should hear about right sided injection.  Get labs now.  Recheck in 1 month if not better.   Come back or go to the emergency room if you notice new weakness new numbness problems walking or bowel or bladder problems.

## 2018-01-02 LAB — CBC WITH DIFFERENTIAL/PLATELET
BASOS ABS: 20 {cells}/uL (ref 0–200)
Basophils Relative: 0.3 %
Eosinophils Absolute: 20 cells/uL (ref 15–500)
Eosinophils Relative: 0.3 %
HEMATOCRIT: 40.4 % (ref 38.5–50.0)
Hemoglobin: 14.3 g/dL (ref 13.2–17.1)
LYMPHS ABS: 1872 {cells}/uL (ref 850–3900)
MCH: 29.9 pg (ref 27.0–33.0)
MCHC: 35.4 g/dL (ref 32.0–36.0)
MCV: 84.3 fL (ref 80.0–100.0)
MPV: 9.4 fL (ref 7.5–12.5)
Monocytes Relative: 6.9 %
NEUTROS PCT: 63.7 %
Neutro Abs: 4141 cells/uL (ref 1500–7800)
PLATELETS: 143 10*3/uL (ref 140–400)
RBC: 4.79 10*6/uL (ref 4.20–5.80)
RDW: 12.4 % (ref 11.0–15.0)
TOTAL LYMPHOCYTE: 28.8 %
WBC: 6.5 10*3/uL (ref 3.8–10.8)
WBCMIX: 449 {cells}/uL (ref 200–950)

## 2018-01-02 LAB — SEDIMENTATION RATE: Sed Rate: 2 mm/h (ref 0–15)

## 2018-01-02 LAB — CK: Total CK: 135 U/L (ref 44–196)

## 2018-01-02 LAB — TSH: TSH: 0.71 mIU/L (ref 0.40–4.50)

## 2018-01-02 NOTE — Progress Notes (Signed)
Shane Blackburn is a 47 y.o. male who presents to [facility] today for follow-up of leg pain.  Shane Blackburn was seen on September 6 for left-sided low back pain with bilateral lumbar radicular symptoms.  He had an MRI in early September that showed bulging disc and neuro foraminal stenosis throughout lumbar spine but worse at left L3.  He had a trial of physical therapy and left L3 epidural steroid injection which helped his left side symptoms completely.  He notes physical therapy has also helped his low back pain.  However he continues to experience pain in the right lateral to anterior thigh.  He notes this has improved with physical therapy but is still present.  He notes the pain comes and goes and is located at the proximal to distal anterior thigh and occasionally in the lateral thigh.  He is improved with physical therapy including dry needling and stretching.  He notes over symptoms are still present.  No weakness or numbness or loss of function.  No bowel bladder dysfunction.  No fevers or chills.  Patient has a pertinent medical history for well-controlled hypertension and hypothyroidism.  ROS:  As above  Exam:  BP 134/80   Pulse 63   Wt 167 lb (75.8 kg)   BMI 24.66 kg/m  General: Well Developed, well nourished, and in no acute distress.  Neuro/Psych: Alert and oriented x3, extra-ocular muscles intact, able to move all 4 extremities, sensation grossly intact. Skin: Warm and dry, no rashes noted.  Respiratory: Not using accessory muscles, speaking in full sentences, trachea midline.  Cardiovascular: Pulses palpable, no extremity edema. Abdomen: Does not appear distended. MSK:  L-spine: Nontender to spinal midline normal lumbar motion. Right hip normal-appearing tender to palpation greater trochanter.  Hip abduction strength diminished 4/5. Left hip normal-appearing nontender normal motion. Right thigh normal-appearing nontender normal  motion.  Left ith normal-appearing nontender normal motion. Normal gait.    Lab and Radiology Results Results for orders placed or performed in visit on 01/01/18 (from the past 72 hour(s))  CBC with Differential/Platelet     Status: None   Collection Time: 01/01/18  3:42 PM  Result Value Ref Range   WBC 6.5 3.8 - 10.8 Thousand/uL   RBC 4.79 4.20 - 5.80 Million/uL   Hemoglobin 14.3 13.2 - 17.1 g/dL   HCT 16.1 09.6 - 04.5 %   MCV 84.3 80.0 - 100.0 fL   MCH 29.9 27.0 - 33.0 pg   MCHC 35.4 32.0 - 36.0 g/dL   RDW 40.9 81.1 - 91.4 %   Platelets 143 140 - 400 Thousand/uL   MPV 9.4 7.5 - 12.5 fL   Neutro Abs 4,141 1,500 - 7,800 cells/uL   Lymphs Abs 1,872 850 - 3,900 cells/uL   WBC mixed population 449 200 - 950 cells/uL   Eosinophils Absolute 20 15 - 500 cells/uL   Basophils Absolute 20 0 - 200 cells/uL   Neutrophils Relative % 63.7 %   Total Lymphocyte 28.8 %   Monocytes Relative 6.9 %   Eosinophils Relative 0.3 %   Basophils Relative 0.3 %  CK     Status: None   Collection Time: 01/01/18  3:42 PM  Result Value Ref Range   Total CK 135 44 - 196 U/L  TSH     Status: None   Collection Time: 01/01/18  3:42 PM  Result Value Ref Range   TSH 0.71 0.40 - 4.50 mIU/L  Sedimentation rate     Status:  None   Collection Time: 01/01/18  3:42 PM  Result Value Ref Range   Sed Rate 2 0 - 15 mm/h    EXAM: MRI LUMBAR SPINE WITHOUT CONTRAST  TECHNIQUE: Multiplanar, multisequence MR imaging of the lumbar spine was performed. No intravenous contrast was administered.  COMPARISON:  05/29/2017  FINDINGS: Segmentation:  Standard.  Minimally prominent S1-2 disc.  Alignment:  Unchanged trace retrolisthesis of L5 on S1.  Vertebrae: No fracture, suspicious osseous lesion, or significant marrow edema.  Conus medullaris and cauda equina: Conus extends to the L1-2 level. Conus and cauda equina appear normal.  Paraspinal and other soft tissues: Unremarkable.  Disc levels:  Disc  desiccation from L2-3 to L5-S1 with only at most mild disc space narrowing.  T12-L1 and L1-2: Negative.  L2-3: Left foraminal to extraforaminal disc protrusion with annular fissure results in mild spinal stenosis and potential impingement of the extraforaminal left L2 nerve, unchanged. Patent spinal canal and right neural foramen.  L3-4: A left foraminal disc extrusion is larger than on the prior study and results in moderate left neural foraminal stenosis and likely impingement of the left L3 nerve root. Patent spinal canal and right neural foramen.  L4-5: The large right foraminal and extraforaminal disc extrusion on the prior study has decreased in size. There is a residual right foraminal and extraforaminal disc protrusion with annular fissure which results in moderate residual right neural foraminal stenosis, potentially still affecting the right L4 nerve. Mild right lateral recess narrowing is unchanged. Mild left neural foraminal stenosis due to mild disc bulging is also unchanged. No significant spinal stenosis.  L5-S1: Mild circumferential disc bulging and a central to left subarticular disc extrusion with minimal caudal migration result in moderate left lateral recess stenosis and moderate left neural foraminal stenosis with potential left L5 and left S1 nerve root impingement, unchanged. Disc also may contact the right S1 nerve root in the canal as before. No significant generalized spinal stenosis.  IMPRESSION: 1. Increased size of left foraminal disc extrusion at L3-4 with increased foraminal stenosis and left L3 nerve root impingement. 2. Decreased size of right foraminal/extraforaminal disc extrusion at L4-5. 3. Unchanged L5-S1 disc extrusion and disc bulging with moderate left lateral recess and left neural foraminal stenosis. 4. Unchanged left foraminal/extraforaminal disc protrusion at L2-3.   Electronically Signed   By: Sebastian Ache M.D.   On:  11/21/2017 08:42  I personally (independently) visualized and performed the interpretation of the images attached in this note.      Assessment and Plan: 47 y.o. male with  Persistent right thigh pain following significant improvement of left lumbar radicular pain after physical therapy and epidural steroid injection.  The cause of the right thigh pain is somewhat unclear at this time.  I think it is more likely to be lumbar radicular pain similar to the left side however his pain potentially crosses dermatomes.  I think is reasonable proceed with a metabolic work-up listed below.  Fortunately his lab results came back after the patient left he has a normal TSH and no evidence of myositis.  I think is also reasonable to proceed with a right L3 epidural steroid injection.  Additionally will modify physical therapy orders to include a bit more hip abduction strengthening.  Additionally recommend trying iontophoresis as well.  If not improved with epidural steroid injection in changing physical therapy would proceed with further imaging of the hip and femur/thigh.  May also proceed nerve conduction study.  Recheck in about a  month or so.  CC: Daisy Floro, MD     Orders Placed This Encounter  Procedures  . DG Epidurography    Order Specific Question:   Reason for Exam (SYMPTOM  OR DIAGNOSIS REQUIRED)    Answer:   Right L3    Order Specific Question:   Preferred imaging location?    Answer:   [facility]  . CBC with Differential/Platelet  . CK  . TSH  . Sedimentation rate  . Ambulatory referral to Physical Therapy    Referral Priority:   Routine    Referral Type:   Physical Medicine    Referral Reason:   Specialty Services Required    Requested Specialty:   Physical Therapy   No orders of the defined types were placed in this encounter.   Historical information moved to improve visibility of documentation.  Past Medical History:  Diagnosis Date  . Benign  essential HTN 03/03/2015  . Depression   . Dyslipidemia   . GERD (gastroesophageal reflux disease)   . Hypertension   . Hyperthyroidism    now hypothyroidism  . OSA (obstructive sleep apnea) 03/03/2015   Past Surgical History:  Procedure Laterality Date  . FINGER SURGERY  2010   finger tip  . WISDOM TOOTH EXTRACTION  1996   Social History   Tobacco Use  . Smoking status: Never Smoker  . Smokeless tobacco: Never Used  Substance Use Topics  . Alcohol use: Not on file   family history includes CVA in his father; Diabetes in his mother; Hypertension in his father and mother.  Medications: Current Outpatient Medications  Medication Sig Dispense Refill  . cyclobenzaprine (FLEXERIL) 10 MG tablet Take 1 tablet (10 mg total) by mouth 2 (two) times daily as needed for muscle spasms. 20 tablet 1  . levothyroxine (SYNTHROID, LEVOTHROID) 137 MCG tablet Take 112 mcg by mouth daily before breakfast.     . lisinopril (PRINIVIL,ZESTRIL) 20 MG tablet Take 20 mg by mouth daily.    . traMADol (ULTRAM) 50 MG tablet Take 1 tablet (50 mg total) by mouth every 6 (six) hours as needed for moderate pain. 15 tablet 0   No current facility-administered medications for this visit.    Allergies  Allergen Reactions  . Norvasc [Amlodipine Besylate] Swelling    swelling      Discussed warning signs or symptoms. Please see discharge instructions. Patient expresses understanding.

## 2018-01-03 ENCOUNTER — Encounter: Payer: Self-pay | Admitting: Rehabilitative and Restorative Service Providers"

## 2018-01-03 ENCOUNTER — Ambulatory Visit: Payer: BLUE CROSS/BLUE SHIELD | Admitting: Rehabilitative and Restorative Service Providers"

## 2018-01-03 DIAGNOSIS — R293 Abnormal posture: Secondary | ICD-10-CM

## 2018-01-03 DIAGNOSIS — R29898 Other symptoms and signs involving the musculoskeletal system: Secondary | ICD-10-CM | POA: Diagnosis not present

## 2018-01-03 DIAGNOSIS — M5416 Radiculopathy, lumbar region: Secondary | ICD-10-CM | POA: Diagnosis not present

## 2018-01-03 NOTE — Therapy (Signed)
[facility] [place] [address] [place], [place], [address] Phone: [phone]   Fax:  [phone]  Physical Therapy Treatment  Patient Details  Name: Shane Blackburn MRN: [id number] Date of Birth: 05-Nov-1970 Referring Provider (PT): Dr Denyse Amass    Encounter Date: 01/03/2018  PT End of Session - 01/03/18 0710    Visit Number  6    Number of Visits  12    Date for PT Re-Evaluation  01/10/18    PT Start Time  0711    PT Stop Time  0808    PT Time Calculation (min)  57 min    Activity Tolerance  Patient tolerated treatment well       Past Medical History:  Diagnosis Date  . Benign essential HTN 03/03/2015  . Depression   . Dyslipidemia   . GERD (gastroesophageal reflux disease)   . Hypertension   . Hyperthyroidism    now hypothyroidism  . OSA (obstructive sleep apnea) 03/03/2015    Past Surgical History:  Procedure Laterality Date  . FINGER SURGERY  2010   finger tip  . WISDOM TOOTH EXTRACTION  1996    There were no vitals filed for this visit.  Subjective Assessment - 01/03/18 7425    Subjective  Patient reports that he saw Dr Denyse Amass and will receive another ESI for the Rt side. He has some relief from the DN but things return. Overall some improvement but symptoms continue. Patient was on vacation last week and did a lot of walking and a lot of sitting. He has been walking the dogs for about a mile each day. Today he has pain today on front of the Rt thigh and upward to hip.     Currently in Pain?  Yes    Pain Score  3     Pain Location  Leg    Pain Orientation  Right;Anterior    Pain Descriptors / Indicators  Burning;Tightness    Pain Type  Acute pain;Chronic pain    Pain Onset  More than a month ago    Pain Frequency  Intermittent         [facility] PT Assessment - 01/03/18 0001      Assessment   Medical Diagnosis  Lumbar radiculopathy     Referring Provider (PT)  Dr Denyse Amass     Onset Date/Surgical Date   04/01/17    Hand Dominance  Right    Next MD Visit  4 weeks     Prior Therapy  chiropractic care       AROM   Lumbar Flexion  75%    Lumbar Extension  50% discomfort upper lumbar central     Lumbar - Right Side Bend  70%    Lumbar - Left Side Bend  70%    Lumbar - Right Rotation  70%    Lumbar - Left Rotation  70%                   [facility] Adult PT Treatment/Exercise - 01/03/18 0001      Therapeutic Activites    Therapeutic Activities  --   ball release work anterior Rt hip prone/standing      Lumbar Exercises: Stretches   Hip Flexor Stretch  Right;Left;2 reps;30 seconds   seated    Quad Stretch  Right;Left;60 seconds;3 reps   prone with strap; added foam roll distal thigh      Lumbar Exercises: Aerobic   Tread Mill  2.0 mph x 5  min       Lumbar Exercises: Supine   Bridge  10 reps;5 seconds   core engaged      Lumbar Exercises: Prone   Other Prone Lumbar Exercises  quad set extending knee 5 sec x 5       Moist Heat Therapy   Number Minutes Moist Heat  20 Minutes    Moist Heat Location  Lumbar Spine   and Rt thigh to knee      Electrical Stimulation   Electrical Stimulation Location  anterior Rt thigh quads     Electrical Stimulation Action  IFC    Electrical Stimulation Parameters  to tolerance    Electrical Stimulation Goals  Pain;Tone      Manual Therapy   Manual therapy comments  pt supine     Soft tissue mobilization  deep tissue work through the anterior Rt thigh - quads    IASTM Rt quad    Myofascial Release  hip flexors/anterior thigh                   PT Long Term Goals - 12/04/17 0734      PT LONG TERM GOAL #1   Title  Decrease LE radicular symptoms by 50-75% with patient reporting intermittent LE symptoms decreased in frequency, intensity and duration 01/10/18    Time  6    Period  Weeks    Status  On-going      PT LONG TERM GOAL #2   Title  Increase trunk and LE mobility and ROM to WFL's throughout and painfree 01/10/18     Time  6    Period  Weeks    Status  On-going      PT LONG TERM GOAL #3   Title  Improve core stability and strength allowing patient to participate in 20-30 min of lumbar stabilization exercises without pain 102/3/19    Time  6    Period  Weeks    Status  On-going      PT LONG TERM GOAL #4   Title  Patient to demonstrate and verbalize correct ergonomics and body mechanics for functional activities at work and home 01/10/18    Time  6    Period  Weeks    Status  On-going      PT LONG TERM GOAL #5   Title  Independent in HEP 01/10/18    Time  6    Period  Weeks    Status  On-going      PT LONG TERM GOAL #6   Title  Improve FOTO to </= 36% limitation 01/10/18    Time  6    Period  Weeks    Status  On-going            Plan - 01/03/18 0717    Clinical Impression Statement  Patient reports some improvement overall with more variable symptoms and less pain. He has some improvement in trunk and LE mobility; persistent tightness through the anterior Rt hip and thigh. Progresing gradually toward stated goals of therapy.     Rehab Potential  Good    PT Frequency  2x / week    PT Duration  6 weeks    PT Treatment/Interventions  Patient/family education;ADLs/Self Care Home Management;Cryotherapy;Electrical Stimulation;Iontophoresis 4mg /ml Dexamethasone;Moist Heat;Ultrasound;Dry needling;Manual techniques;Neuromuscular re-education;Functional mobility training;Therapeutic activities;Therapeutic exercise    PT Next Visit Plan  assess pelvic alignment. continue core stabilization - continue DN and stretching     Consulted and Agree with  Plan of Care  Patient       Patient will benefit from skilled therapeutic intervention in order to improve the following deficits and impairments:  Postural dysfunction, Improper body mechanics, Pain, Increased fascial restricitons, Increased muscle spasms, Decreased mobility, Decreased range of motion, Decreased strength, Decreased activity  tolerance  Visit Diagnosis: Radiculopathy, lumbar region  Other symptoms and signs involving the musculoskeletal system  Abnormal posture     Problem List Patient Active Problem List   Diagnosis Date Noted  . OSA (obstructive sleep apnea) 03/03/2015  . Benign essential HTN 03/03/2015  . Hypothyroidism 02/14/2009  . HYPERLIPIDEMIA 02/14/2009  . DEPRESSION 02/14/2009  . ACUTE PHARYNGITIS 02/14/2009  . GERD 02/14/2009  . TRAUMATIC AMP OTH FINGER WITHOUT MENTION COMP 07/09/2008    Maximilien Hayashi Rober Minion PT, MPH  01/03/2018, 7:56 AM  [facility] [address] [place], [place], [address] Phone: [phone]   Fax:  [phone]  Name: Shane Blackburn MRN: [id number] Date of Birth: 1971-03-07

## 2018-01-05 ENCOUNTER — Ambulatory Visit: Payer: BLUE CROSS/BLUE SHIELD | Admitting: Physical Therapy

## 2018-01-05 ENCOUNTER — Ambulatory Visit: Payer: BLUE CROSS/BLUE SHIELD | Admitting: Family Medicine

## 2018-01-05 DIAGNOSIS — M5416 Radiculopathy, lumbar region: Secondary | ICD-10-CM

## 2018-01-05 DIAGNOSIS — R293 Abnormal posture: Secondary | ICD-10-CM

## 2018-01-05 DIAGNOSIS — R29898 Other symptoms and signs involving the musculoskeletal system: Secondary | ICD-10-CM

## 2018-01-05 NOTE — Therapy (Addendum)
[place], [place], [address] Phone: [phone]   Fax:  [phone]  Physical Therapy Treatment  Patient Details  Name: Shane Blackburn MRN: [id number] Date of Birth: 10-07-1970 Referring Provider (PT): Dr. Georgina Snell    Encounter Date: 01/05/2018  PT End of Session - 01/05/18 1526    Visit Number  7    Number of Visits  12    Date for PT Re-Evaluation  01/10/18    PT Start Time  1527    PT Stop Time  1623    PT Time Calculation (min)  56 min    Activity Tolerance  Patient tolerated treatment well    Behavior During Therapy  [facility] for tasks assessed/performed       Past Medical History:  Diagnosis Date  . Benign essential HTN 03/03/2015  . Depression   . Dyslipidemia   . GERD (gastroesophageal reflux disease)   . Hypertension   . Hyperthyroidism    now hypothyroidism  . OSA (obstructive sleep apnea) 03/03/2015    Past Surgical History:  Procedure Laterality Date  . FINGER SURGERY  2010   finger tip  . WISDOM TOOTH EXTRACTION  1996    There were no vitals filed for this visit.  Subjective Assessment - 01/05/18 1526    Subjective  Shane Blackburn reports he was sore after DN last session, but felt some relief that evening and the next day.  Pt was able to "crawl all over and around" tractor trailers without a lot of pain/discomfort.   Pt reports the numbness in his LLE has gone away.  His Right thigh has improved by 50%.     Patient Stated Goals  reduce pain in both legs; prevent recurrent problems - core strengthening     Currently in Pain?  Yes    Pain Score  2     Pain Location  Leg    Pain Orientation  Right;Anterior;Lateral    Aggravating Factors   walking for prolonged periods    Pain Relieving Factors  sitting or laying down         [facility] PT Assessment - 01/05/18 0001      Assessment   Medical Diagnosis  Lumbar radiculopathy     Referring Provider (PT)  Dr. Georgina Snell     Onset  Date/Surgical Date  04/01/17    Hand Dominance  Right    Next MD Visit  4 weeks     Prior Therapy  chiropractic care       [facility] Adult PT Treatment/Exercise - 01/05/18 0001      Lumbar Exercises: Stretches   Passive Hamstring Stretch  Right;Left;3 reps;30 seconds    Hip Flexor Stretch  Right;Left;2 reps;30 seconds;Limitations   supine with opp knee to chest   Hip Flexor Stretch Limitations  no stretch felt in seated position; switched to supine with leg off of table    Prone on Elbows Stretch  5 reps;10 seconds    Quad Stretch  Right;Left;3 reps;30 seconds   prone with strap; added foam roll distal thigh      Lumbar Exercises: Aerobic   Tread Mill  2.2 mph x 5 min    PTA present to monitor and discuss progress     Lumbar Exercises: Supine   Bridge  10 reps;5 seconds   core engaged      Lumbar Exercises: Prone   Opposite Arm/Leg Raise  Right arm/Left leg;Left arm/Right leg;5 reps;2 seconds   2 sets  Moist Heat Therapy   Number Minutes Moist Heat  15 Minutes    Moist Heat Location  Lumbar Spine   and Rt thigh to knee      Electrical Stimulation   Electrical Stimulation Location  anterior Rt thigh quads     Electrical Stimulation Action  IFC    Electrical Stimulation Parameters  to pt tolerance    Electrical Stimulation Goals  Pain;Tone      Manual Therapy   Manual therapy comments  pt supine     Soft tissue mobilization  IASTM to Rt quad (mostly lateral) to decrease fascial restrictions;  TPR to Rt iliacus/ psoas    Myofascial Release  MFR to Rt distal lateral quad                   PT Long Term Goals - 01/05/18 1540      PT LONG TERM GOAL #1   Title  Decrease LE radicular symptoms by 50-75% with patient reporting intermittent LE symptoms decreased in frequency, intensity and duration 01/10/18    Time  6    Period  Weeks    Status  Partially Met   50-60% reduction reported 01/05/18     PT LONG TERM GOAL #2   Title  Increase trunk and LE mobility and  ROM to WFL's throughout and painfree 01/10/18    Time  6    Period  Weeks    Status  On-going      PT LONG TERM GOAL #3   Title  Improve core stability and strength allowing patient to participate in 20-30 min of lumbar stabilization exercises without pain 102/3/19    Time  6    Period  Weeks    Status  On-going      PT LONG TERM GOAL #4   Title  Patient to demonstrate and verbalize correct ergonomics and body mechanics for functional activities at work and home 01/10/18    Time  6    Period  Weeks    Status  On-going      PT LONG TERM GOAL #5   Title  Independent in HEP 01/10/18    Time  6    Period  Weeks    Status  On-going      PT LONG TERM GOAL #6   Title  Improve FOTO to </= 36% limitation 01/10/18    Time  6    Period  Weeks    Status  On-going            Plan - 01/05/18 1618    Clinical Impression Statement  Pt has partially met LTG #1 with overall decrease in radicular symptoms.  Pt continues to have positive response to DN.  Persistent fascial tightness in Rt hip/thigh; responding well to DN and manual therapy. He tolerated all exercises well, without increase in symptoms.  Progressing towards goals.     Rehab Potential  Good    Clinical Impairments Affecting Rehab Potential  chronic nature of complaints     PT Frequency  2x / week    PT Duration  6 weeks    PT Treatment/Interventions  Patient/family education;ADLs/Self Care Home Management;Cryotherapy;Electrical Stimulation;Iontophoresis 51m/ml Dexamethasone;Moist Heat;Ultrasound;Dry needling;Manual techniques;Neuromuscular re-education;Functional mobility training;Therapeutic activities;Therapeutic exercise    PT Next Visit Plan  continue core stabilization - continue DN/manual therapy and stretching     Consulted and Agree with Plan of Care  Patient       Patient will benefit from skilled therapeutic  intervention in order to improve the following deficits and impairments:  Postural dysfunction, Improper  body mechanics, Pain, Increased fascial restricitons, Increased muscle spasms, Decreased mobility, Decreased range of motion, Decreased strength, Decreased activity tolerance  Visit Diagnosis: Radiculopathy, lumbar region  Other symptoms and signs involving the musculoskeletal system  Abnormal posture     Problem List Patient Active Problem List   Diagnosis Date Noted  . OSA (obstructive sleep apnea) 03/03/2015  . Benign essential HTN 03/03/2015  . Hypothyroidism 02/14/2009  . HYPERLIPIDEMIA 02/14/2009  . DEPRESSION 02/14/2009  . ACUTE PHARYNGITIS 02/14/2009  . GERD 02/14/2009  . TRAUMATIC AMP OTH FINGER WITHOUT MENTION COMP 07/09/2008   Kerin Perna, PTA 01/05/18 4:26 PM  [place], [place], [address] Phone: [phone]   Fax:  [phone]  Name: Shane Blackburn MRN: [id number] Date of Birth: 01/30/1971

## 2018-01-10 ENCOUNTER — Encounter: Payer: Self-pay | Admitting: Physical Therapy

## 2018-01-10 ENCOUNTER — Ambulatory Visit: Payer: BLUE CROSS/BLUE SHIELD | Admitting: Physical Therapy

## 2018-01-10 DIAGNOSIS — R29898 Other symptoms and signs involving the musculoskeletal system: Secondary | ICD-10-CM | POA: Diagnosis not present

## 2018-01-10 DIAGNOSIS — R293 Abnormal posture: Secondary | ICD-10-CM

## 2018-01-10 DIAGNOSIS — M5416 Radiculopathy, lumbar region: Secondary | ICD-10-CM

## 2018-01-10 NOTE — Therapy (Signed)
[place]  [place], [place], [address] Phone: [phone]   Fax:  [phone]  Physical Therapy Treatment  Patient Details  Name: Shane Blackburn MRN: [id number] Date of Birth: 1970/03/31 Referring Provider (PT): Dr. Georgina Snell    Encounter Date: 01/10/2018  PT End of Session - 01/10/18 0717    Visit Number  8    Number of Visits  12    Date for PT Re-Evaluation  01/10/18    PT Start Time  0715    PT Stop Time  0816    PT Time Calculation (min)  61 min    Activity Tolerance  Patient tolerated treatment well    Behavior During Therapy  [facility] for tasks assessed/performed       Past Medical History:  Diagnosis Date  . Benign essential HTN 03/03/2015  . Depression   . Dyslipidemia   . GERD (gastroesophageal reflux disease)   . Hypertension   . Hyperthyroidism    now hypothyroidism  . OSA (obstructive sleep apnea) 03/03/2015    Past Surgical History:  Procedure Laterality Date  . FINGER SURGERY  2010   finger tip  . WISDOM TOOTH EXTRACTION  1996    There were no vitals filed for this visit.  Subjective Assessment - 01/10/18 0718    Subjective  Pt reports he was sore after last session, but pain resolved.  He had 2 days of relief, but pain returned.  He went to gym last night and walked on treadmill for 32 min at 3.2 mph and incline of 2; started to feel some increase in pain.  He stretched his calves and this helped some.     Currently in Pain?  Yes    Pain Score  3     Pain Location  Leg    Pain Orientation  Right;Anterior;Lateral    Pain Descriptors / Indicators  Tightness    Aggravating Factors   standing or walking for prolonged periods    Pain Relieving Factors  sitting, laying down         [facility] PT Assessment - 01/10/18 0001      Assessment   Medical Diagnosis  Lumbar radiculopathy     Referring Provider (PT)  Dr. Georgina Snell     Onset Date/Surgical Date  04/01/17    Hand Dominance  Right     Next MD Visit  4 weeks     Prior Therapy  chiropractic care         [facility] Adult PT Treatment/Exercise - 01/10/18 0001      Exercises   Exercises  Lumbar      Lumbar Exercises: Stretches   Hip Flexor Stretch  Right;3 reps;30 seconds   high kneeling, arm overhead    Prone on Elbows Stretch  5 reps;10 seconds    Quad Stretch  Right;Left;3 reps;30 seconds   prone with strap   Gastroc Stretch  Right;Left;2 reps;60 seconds    Gastroc Stretch Limitations  soleus stretch bilat on slant board x 30 sec     Other Lumbar Stretch Exercise  3 way doorway stretch 30 sec x 1 each position       Lumbar Exercises: Aerobic   Tread Mill  2.2-2.7 mph x 5 min      Moist Heat Therapy   Number Minutes Moist Heat  15 Minutes    Moist Heat Location  Knee;Hip   Rt thigh     Electrical Stimulation   Electrical Stimulation Location  anterior  Rt thigh quads     Electrical Stimulation Action  IFC    Electrical Stimulation Parameters  to pt tolerance, x 15 min     Electrical Stimulation Goals  Pain      Manual Therapy   Manual therapy comments  self release work with ball to Rt psoas x 1.5 min     Soft tissue mobilization  IASTM to Rt quad (mostly lateral) to decrease fascial restrictions;  TPR to Rt prox quad/ TFL with active contract/relax    Myofascial Release  MFR to Rt distal lateral quad                   PT Long Term Goals - 01/05/18 1540      PT LONG TERM GOAL #1   Title  Decrease LE radicular symptoms by 50-75% with patient reporting intermittent LE symptoms decreased in frequency, intensity and duration 01/10/18    Time  6    Period  Weeks    Status  Partially Met   50-60% reduction reported 01/05/18     PT LONG TERM GOAL #2   Title  Increase trunk and LE mobility and ROM to WFL's throughout and painfree 01/10/18    Time  6    Period  Weeks    Status  On-going      PT LONG TERM GOAL #3   Title  Improve core stability and strength allowing patient to participate in  20-30 min of lumbar stabilization exercises without pain 102/3/19    Time  6    Period  Weeks    Status  On-going      PT LONG TERM GOAL #4   Title  Patient to demonstrate and verbalize correct ergonomics and body mechanics for functional activities at work and home 01/10/18    Time  6    Period  Weeks    Status  On-going      PT LONG TERM GOAL #5   Title  Independent in HEP 01/10/18    Time  6    Period  Weeks    Status  On-going      PT LONG TERM GOAL #6   Title  Improve FOTO to </= 36% limitation 01/10/18    Time  6    Period  Weeks    Status  On-going            Plan - 01/10/18 0806    Clinical Impression Statement  Pt continues with tightness in bilat quad and Rt hip flexor; tolerated high kneeling hip flexor stretch without difficulty.  Some fascial tightness noted between lateral Rt quad and ITB, as well as TFL.  Reduced tightness with MFR and TPR to area. Pt reported reduction in pain level at conclusion of session. Pt having injection to L2-3 tomorrow; will hold therapy until next week.      Rehab Potential  Good    PT Frequency  2x / week    PT Duration  6 weeks    PT Treatment/Interventions  Patient/family education;ADLs/Self Care Home Management;Cryotherapy;Electrical Stimulation;Iontophoresis 47m/ml Dexamethasone;Moist Heat;Ultrasound;Dry needling;Manual techniques;Neuromuscular re-education;Functional mobility training;Therapeutic activities;Therapeutic exercise    Consulted and Agree with Plan of Care  Patient       Patient will benefit from skilled therapeutic intervention in order to improve the following deficits and impairments:  Postural dysfunction, Improper body mechanics, Pain, Increased fascial restricitons, Increased muscle spasms, Decreased mobility, Decreased range of motion, Decreased strength, Decreased activity tolerance  Visit Diagnosis: Radiculopathy, lumbar  region  Other symptoms and signs involving the musculoskeletal system  Abnormal  posture     Problem List Patient Active Problem List   Diagnosis Date Noted  . OSA (obstructive sleep apnea) 03/03/2015  . Benign essential HTN 03/03/2015  . Hypothyroidism 02/14/2009  . HYPERLIPIDEMIA 02/14/2009  . DEPRESSION 02/14/2009  . ACUTE PHARYNGITIS 02/14/2009  . GERD 02/14/2009  . TRAUMATIC AMP OTH FINGER WITHOUT MENTION COMP 07/09/2008   Kerin Perna, PTA 01/10/18 8:30 AM  [place], [place], [address] Phone: [phone]   Fax:  [phone]  Name: Shane Blackburn MRN: [id number] Date of Birth: [birth year]

## 2018-01-11 ENCOUNTER — Ambulatory Visit
Admission: RE | Admit: 2018-01-11 | Discharge: 2018-01-11 | Disposition: A | Discharge status: Home / Self Care | Payer: BLUE CROSS/BLUE SHIELD | Source: Ambulatory Visit | Attending: Family Medicine | Admitting: Family Medicine

## 2018-01-11 DIAGNOSIS — M47817 Spondylosis without myelopathy or radiculopathy, lumbosacral region: Secondary | ICD-10-CM | POA: Diagnosis not present

## 2018-01-11 MED ORDER — IOPAMIDOL (ISOVUE-M 200) INJECTION 41%
1.0000 mL | Freq: Once | INTRAMUSCULAR | Status: AC
  Administered 2018-01-11: 1 mL via EPIDURAL

## 2018-01-11 MED ORDER — METHYLPREDNISOLONE ACETATE 40 MG/ML INJ SUSP (RADIOLOG
120.0000 mg | Freq: Once | INTRAMUSCULAR | Status: AC
  Administered 2018-01-11: 120 mg via EPIDURAL

## 2018-01-11 NOTE — Discharge Instructions (Signed)

## 2018-01-12 ENCOUNTER — Encounter: Payer: BLUE CROSS/BLUE SHIELD | Admitting: Physical Therapy

## 2018-01-15 ENCOUNTER — Encounter: Payer: Self-pay | Admitting: Rehabilitative and Restorative Service Providers"

## 2018-01-15 ENCOUNTER — Ambulatory Visit: Payer: BLUE CROSS/BLUE SHIELD | Admitting: Rehabilitative and Restorative Service Providers"

## 2018-01-15 DIAGNOSIS — M5416 Radiculopathy, lumbar region: Secondary | ICD-10-CM

## 2018-01-15 DIAGNOSIS — R293 Abnormal posture: Secondary | ICD-10-CM

## 2018-01-15 DIAGNOSIS — R29898 Other symptoms and signs involving the musculoskeletal system: Secondary | ICD-10-CM

## 2018-01-15 NOTE — Therapy (Signed)
[facility] [place] [address] [place], [place], [address] Phone: [phone]   Fax:  [phone]  Physical Therapy Treatment  Patient Details  Name: Shane Blackburn MRN: [id number] Date of Birth: 1970/10/30 Referring Provider (PT): Dr. Denyse Amass    Encounter Date: 01/15/2018  PT End of Session - 01/15/18 0716    Visit Number  9    Number of Visits  12    Date for PT Re-Evaluation  02/26/18    PT Start Time  0715    PT Stop Time  0814    PT Time Calculation (min)  59 min    Activity Tolerance  Patient tolerated treatment well       Past Medical History:  Diagnosis Date  . Benign essential HTN 03/03/2015  . Depression   . Dyslipidemia   . GERD (gastroesophageal reflux disease)   . Hypertension   . Hyperthyroidism    now hypothyroidism  . OSA (obstructive sleep apnea) 03/03/2015    Past Surgical History:  Procedure Laterality Date  . FINGER SURGERY  2010   finger tip  . WISDOM TOOTH EXTRACTION  1996    There were no vitals filed for this visit.  Subjective Assessment - 01/15/18 0719    Subjective  Patient reports that he had an [facility] Thursday last week and it was a lot worse than the first ESI. Not sure he recenved any benefit from the injection last week. Overall he has noted some improvement in Lt LE especially - less in the Rt but some better. Patient is pain free when he is lying down or sitting.     Currently in Pain?  Yes    Pain Score  4     Pain Location  Leg    Pain Orientation  Right;Anterior;Lateral    Pain Descriptors / Indicators  Aching    Pain Type  Acute pain;Chronic pain    Pain Onset  More than a month ago    Pain Frequency  Intermittent         [facility] PT Assessment - 01/15/18 0001      Assessment   Medical Diagnosis  Lumbar radiculopathy     Referring Provider (PT)  Dr. Denyse Amass     Onset Date/Surgical Date  04/01/17    Hand Dominance  Right    Next MD Visit  PRN     Prior Therapy   chiropractic care       AROM   Lumbar Flexion  75%    Lumbar Extension  60% discomfort upper lumbar central     Lumbar - Right Side Bend  7%%    Lumbar - Left Side Bend  75%    Lumbar - Right Rotation  75%    Lumbar - Left Rotation  75%      Flexibility   Hamstrings  tight Rt > Lt     Quadriceps  tight bilat     ITB  tight bilat     Piriformis  tight Lt > Rt       Palpation   Palpation comment  muscular tightness Rt > Lt quads; ITB; lumbar to posterior hip musculature Lt > Rt                    [facility] Adult PT Treatment/Exercise - 01/15/18 0001      Self-Care   Self-Care  --   instruction in myofacial release work w/ massage stick      Lumbar Exercises:  Stretches   Hip Flexor Stretch  Right;3 reps;30 seconds   high kneeling, arm overhead    Quad Stretch  Right;Left;3 reps;30 seconds   prone with strap     Lumbar Exercises: Aerobic   Tread Mill  2.2-2.7 mph x 5 min      Moist Heat Therapy   Number Minutes Moist Heat  20 Minutes    Moist Heat Location  Knee;Hip   Rt thigh     Electrical Stimulation   Electrical Stimulation Location  anterior Rt thigh quads     Electrical Stimulation Action  IFC    Electrical Stimulation Parameters  to tolerance    Electrical Stimulation Goals  Pain;Tone      Manual Therapy   Manual therapy comments  pt supine     Soft tissue mobilization  deep tissue work through the anterior Rt thigh through quads and lateral to ITB     Myofascial Release  Rt quad        Trigger Point Dry Needling - 01/15/18 0738    Consent Given?  Yes    Muscles Treated Lower Body  --   Rt with estim    Quadriceps Response  Palpable increased muscle length                PT Long Term Goals - 01/15/18 0717      PT LONG TERM GOAL #1   Title  Decrease LE radicular symptoms by 50-75% with patient reporting intermittent LE symptoms decreased in frequency, intensity and duration 02/26/18    Time  12    Period  Weeks    Status  Revised       PT LONG TERM GOAL #2   Title  Increase trunk and LE mobility and ROM to WFL's throughout and painfree 02/26/18    Time  12    Period  Weeks    Status  Revised      PT LONG TERM GOAL #3   Title  Improve core stability and strength allowing patient to participate in 20-30 min of lumbar stabilization exercises without pain 02/26/18    Time  12    Period  Weeks    Status  Revised      PT LONG TERM GOAL #4   Title  Patient to demonstrate and verbalize correct ergonomics and body mechanics for functional activities at work and home 02/26/18    Time  12    Period  Weeks    Status  Revised      PT LONG TERM GOAL #5   Title  Independent in HEP 02/26/18    Time  12    Period  Weeks    Status  Revised      PT LONG TERM GOAL #6   Title  Improve FOTO to </= 36% limitation 02/26/18    Time  12    Period  Weeks    Status  Revised            Plan - 01/15/18 0749    Clinical Impression Statement  Patient continues to progress gradually toward stated goals. He has improved lumbar mobility and reports decreased pain. He continues to have palpable tightness through the lumbar and hip musculature as well as anteriorly through the hip flexors and quads. Responded well to self massage with massage stick and DN followed by manual work.     Rehab Potential  Good    Clinical Impairments Affecting Rehab Potential  chronic nature of complaints  PT Duration  6 weeks    PT Treatment/Interventions  Patient/family education;ADLs/Self Care Home Management;Cryotherapy;Electrical Stimulation;Iontophoresis 4mg /ml Dexamethasone;Moist Heat;Ultrasound;Dry needling;Manual techniques;Neuromuscular re-education;Functional mobility training;Therapeutic activities;Therapeutic exercise    PT Next Visit Plan  continue core stabilization - continue DN/manual therapy and stretching     Consulted and Agree with Plan of Care  Patient       Patient will benefit from skilled therapeutic intervention in order to  improve the following deficits and impairments:  Postural dysfunction, Improper body mechanics, Pain, Increased fascial restricitons, Increased muscle spasms, Decreased mobility, Decreased range of motion, Decreased strength, Decreased activity tolerance  Visit Diagnosis: Radiculopathy, lumbar region - Plan: PT plan of care cert/re-cert  Other symptoms and signs involving the musculoskeletal system - Plan: PT plan of care cert/re-cert  Abnormal posture - Plan: PT plan of care cert/re-cert     Problem List Patient Active Problem List   Diagnosis Date Noted  . OSA (obstructive sleep apnea) 03/03/2015  . Benign essential HTN 03/03/2015  . Hypothyroidism 02/14/2009  . HYPERLIPIDEMIA 02/14/2009  . DEPRESSION 02/14/2009  . ACUTE PHARYNGITIS 02/14/2009  . GERD 02/14/2009  . TRAUMATIC AMP OTH FINGER WITHOUT MENTION COMP 07/09/2008    Celyn Rober Minion PT, MPH  01/15/2018, 8:00 AM  [facility] [address] [place], [place], [address] Phone: [phone]   Fax:  [phone]  Name: Shane Blackburn MRN: [id number] Date of Birth: 02-05-1971

## 2018-01-19 ENCOUNTER — Encounter: Payer: Self-pay | Admitting: Rehabilitative and Restorative Service Providers"

## 2018-01-19 ENCOUNTER — Ambulatory Visit: Payer: BLUE CROSS/BLUE SHIELD | Admitting: Rehabilitative and Restorative Service Providers"

## 2018-01-19 DIAGNOSIS — R293 Abnormal posture: Secondary | ICD-10-CM

## 2018-01-19 DIAGNOSIS — M5416 Radiculopathy, lumbar region: Secondary | ICD-10-CM

## 2018-01-19 DIAGNOSIS — R29898 Other symptoms and signs involving the musculoskeletal system: Secondary | ICD-10-CM

## 2018-01-19 NOTE — Patient Instructions (Signed)
  Quads / HF, Supine   Lie near edge of bed, pull both knees up toward chest. Hold one knee as you drop the other leg off the edge of the bed.  Use strap to bend knee  Relax hanging knee/can bend knee back if indicated. Hold 30 seconds. Repeat 3 times per session. Do 2-3 sessions per day.  Quads / HF, Prone KNEE: Quadriceps - Prone add foam roll of towel roll just above the knee     Place strap around ankle. Bring ankle toward buttocks. Press hip into surface. Hold 30 seconds. Repeat 3 times per session. Do 2-3 sessions per day.

## 2018-01-19 NOTE — Therapy (Signed)
[facility] [place] [address] [place], [place], [address] Phone: [phone]   Fax:  [phone]  Physical Therapy Treatment  Patient Details  Name: Shane Blackburn MRN: [id number] Date of Birth: 1970/04/11 Referring Provider (PT): Dr. Denyse Amass    Encounter Date: 01/19/2018  PT End of Session - 01/19/18 0805    Visit Number  10    Number of Visits  12    Date for PT Re-Evaluation  02/26/18    PT Start Time  0802    PT Stop Time  0857    PT Time Calculation (min)  55 min    Activity Tolerance  Patient tolerated treatment well       Past Medical History:  Diagnosis Date  . Benign essential HTN 03/03/2015  . Depression   . Dyslipidemia   . GERD (gastroesophageal reflux disease)   . Hypertension   . Hyperthyroidism    now hypothyroidism  . OSA (obstructive sleep apnea) 03/03/2015    Past Surgical History:  Procedure Laterality Date  . FINGER SURGERY  2010   finger tip  . WISDOM TOOTH EXTRACTION  1996    There were no vitals filed for this visit.  Subjective Assessment - 01/19/18 0803    Subjective  Pt reports it took 2 days to get better from DN, but by 3rd day he was over the soreness and had pain relief.  He doesn't feel like steriod shot helped. Symptoms have always been on the Rt - period of time - after workout in the gym - resolved after ESL. Now symptoms are on the Rt again.     Currently in Pain?  Yes    Pain Score  4     Pain Location  Hip    Pain Orientation  Right;Proximal    Pain Descriptors / Indicators  Aching    Aggravating Factors   prolonged walking     Pain Relieving Factors  sitting, laying down         [facility] PT Assessment - 01/19/18 0001      Assessment   Medical Diagnosis  Lumbar radiculopathy     Referring Provider (PT)  Dr. Denyse Amass     Onset Date/Surgical Date  04/01/17    Hand Dominance  Right    Next MD Visit  PRN       Observation/Other Assessments   Focus on Therapeutic Outcomes  (FOTO)   29% limited      Flexibility   Hamstrings  tight Rt > Lt     Quadriceps  tight bilat     ITB  tight bilat     Piriformis  tight Lt > Rt       Palpation   Palpation comment  muscular tightness Rt > Lt quads; ITB; lumbar to posterior hip musculature Lt > Rt                    [facility] Adult PT Treatment/Exercise - 01/19/18 0001      Lumbar Exercises: Stretches   Hip Flexor Stretch  Right;3 reps;30 seconds   high kneeling, arm overhead    Hip Flexor Stretch Limitations  added hip flexor stretch supine with strap to increse quad stretch 30 sec x 4 PT assist with 2 reps     Quad Stretch  Right;Left;3 reps;30 seconds   prone with strap - noodle under distal knee    Quad Stretch Limitations  trial of quad stretch in high kneeling increased knee pain-  added quad stretch in standing with strap 30-40 sec x 3       Lumbar Exercises: Aerobic   Tread Mill  2.2-2.7 mph x 5 min      Lumbar Exercises: Supine   AB Set Limitations  3 part core 10 sec x 10       Moist Heat Therapy   Number Minutes Moist Heat  20 Minutes    Moist Heat Location  Knee;Hip;Lumbar Spine   Rt thigh     Electrical Stimulation   Electrical Stimulation Location  anterior Rt thigh quads     Electrical Stimulation Action  IFC    Electrical Stimulation Parameters  to tolerance    Electrical Stimulation Goals  Pain;Tone      Manual Therapy   Manual therapy comments  pt supine     Soft tissue mobilization  deep tissue work through the anterior Rt thigh through quads and lateral to ITB              PT Education - 01/19/18 0832    Education Details  HEP     Person(s) Educated  Patient    Methods  Explanation;Demonstration;Tactile cues;Verbal cues;Handout    Comprehension  Verbalized understanding;Returned demonstration;Verbal cues required;Tactile cues required          PT Long Term Goals - 01/15/18 0717      PT LONG TERM GOAL #1   Title  Decrease LE radicular symptoms by 50-75% with  patient reporting intermittent LE symptoms decreased in frequency, intensity and duration 02/26/18    Time  12    Period  Weeks    Status  Revised      PT LONG TERM GOAL #2   Title  Increase trunk and LE mobility and ROM to WFL's throughout and painfree 02/26/18    Time  12    Period  Weeks    Status  Revised      PT LONG TERM GOAL #3   Title  Improve core stability and strength allowing patient to participate in 20-30 min of lumbar stabilization exercises without pain 02/26/18    Time  12    Period  Weeks    Status  Revised      PT LONG TERM GOAL #4   Title  Patient to demonstrate and verbalize correct ergonomics and body mechanics for functional activities at work and home 02/26/18    Time  12    Period  Weeks    Status  Revised      PT LONG TERM GOAL #5   Title  Independent in HEP 02/26/18    Time  12    Period  Weeks    Status  Revised      PT LONG TERM GOAL #6   Title  Improve FOTO to </= 36% limitation 02/26/18    Time  12    Period  Weeks    Status  Revised            Plan - 01/19/18 0810    Clinical Impression Statement  Symptoms in the Rt LE are gradually improving. He now has the stick massage tool and is using that with good resluts. He is also working on [employer]. DN helpful but makes him sore for a few days. Added stretches for hip flexors and quads with good release of tightness through the anterior Rt quad.      Rehab Potential  Good    Clinical Impairments Affecting Rehab Potential  chronic nature  of complaints     PT Duration  6 weeks    PT Treatment/Interventions  Patient/family education;ADLs/Self Care Home Management;Cryotherapy;Electrical Stimulation;Iontophoresis 4mg /ml Dexamethasone;Moist Heat;Ultrasound;Dry needling;Manual techniques;Neuromuscular re-education;Functional mobility training;Therapeutic activities;Therapeutic exercise    PT Next Visit Plan  continue core stabilization - continue DN/manual therapy and stretching - assess response to additional  stretches. Continue with DN as indicated     Consulted and Agree with Plan of Care  Patient       Patient will benefit from skilled therapeutic intervention in order to improve the following deficits and impairments:  Postural dysfunction, Improper body mechanics, Pain, Increased fascial restricitons, Increased muscle spasms, Decreased mobility, Decreased range of motion, Decreased strength, Decreased activity tolerance  Visit Diagnosis: Radiculopathy, lumbar region  Other symptoms and signs involving the musculoskeletal system  Abnormal posture     Problem List Patient Active Problem List   Diagnosis Date Noted  . OSA (obstructive sleep apnea) 03/03/2015  . Benign essential HTN 03/03/2015  . Hypothyroidism 02/14/2009  . HYPERLIPIDEMIA 02/14/2009  . DEPRESSION 02/14/2009  . ACUTE PHARYNGITIS 02/14/2009  . GERD 02/14/2009  . TRAUMATIC AMP OTH FINGER WITHOUT MENTION COMP 07/09/2008    Othniel Maret Rober Minion PT, MPH  01/19/2018, 8:50 AM  [facility] [address] [place], [place], [address] Phone: [phone]   Fax:  [phone]  Name: Shane Blackburn MRN: [id number] Date of Birth: 1970/12/02

## 2018-01-22 ENCOUNTER — Ambulatory Visit: Payer: BLUE CROSS/BLUE SHIELD | Admitting: Physical Therapy

## 2018-01-22 DIAGNOSIS — M5416 Radiculopathy, lumbar region: Secondary | ICD-10-CM | POA: Diagnosis not present

## 2018-01-22 DIAGNOSIS — R293 Abnormal posture: Secondary | ICD-10-CM

## 2018-01-22 DIAGNOSIS — R29898 Other symptoms and signs involving the musculoskeletal system: Secondary | ICD-10-CM

## 2018-01-22 NOTE — Therapy (Signed)
[facility] [place], [place], [address] Phone: [phone]   Fax:  [phone]  Physical Therapy Treatment  Patient Details  Name: Shane Blackburn MRN: [id number] Date of Birth: 1970-04-28 Referring Provider (PT): Dr. Georgina Snell    Encounter Date: 01/22/2018  PT End of Session - 01/22/18 0719    Visit Number  11    Number of Visits  21    Date for PT Re-Evaluation  02/26/18    PT Start Time  0715    PT Stop Time  0811    PT Time Calculation (min)  56 min    Activity Tolerance  Patient tolerated treatment well    Behavior During Therapy  [facility] for tasks assessed/performed       Past Medical History:  Diagnosis Date  . Benign essential HTN 03/03/2015  . Depression   . Dyslipidemia   . GERD (gastroesophageal reflux disease)   . Hypertension   . Hyperthyroidism    now hypothyroidism  . OSA (obstructive sleep apnea) 03/03/2015    Past Surgical History:  Procedure Laterality Date  . FINGER SURGERY  2010   finger tip  . WISDOM TOOTH EXTRACTION  1996    There were no vitals filed for this visit.  Subjective Assessment - 01/22/18 0720    Subjective  Pt reports the effects of DN didn't last very long.  The pain in his hip varies from day to day. Overall - he feels his pain has improved by 50% since initiating therapy.     Currently in Pain?  Yes    Pain Score  3     Pain Location  Hip    Pain Orientation  Right;Proximal    Pain Descriptors / Indicators  Tightness    Aggravating Factors   prolonged walking     Pain Relieving Factors  sitting, laying down          [facility] PT Assessment - 01/22/18 0001      Assessment   Medical Diagnosis  Lumbar radiculopathy     Referring Provider (PT)  Dr. Georgina Snell     Onset Date/Surgical Date  04/01/17    Hand Dominance  Right    Next MD Visit  PRN       Flexibility   Quadriceps  Rt 120 deg, Lt 110 deg        [facility] Adult PT Treatment/Exercise - 01/22/18 0001       Exercises   Exercises  Knee/Hip      Lumbar Exercises: Stretches   Hip Flexor Stretch  Right;3 reps;30 seconds   seated, leg back, arm overhead    Hip Flexor Stretch Limitations  LLE x 2 reps, seated    Quad Stretch  Right;Left;3 reps;30 seconds   prone with strap - noodle under distal knee    Piriformis Stretch  Right;Left;3 reps;30 seconds      Lumbar Exercises: Aerobic   Tread Mill  2.2-2.7 mph x 5 min      Lumbar Exercises: Prone   Opposite Arm/Leg Raise  Right arm/Left leg;Left arm/Right leg;10 reps;4 seconds      Knee/Hip Exercises: Aerobic   Other Aerobic  laps around gym in between exercises and manual therapy to assess response.       Modalities   Modalities  Ultrasound      Moist Heat Therapy   Number Minutes Moist Heat  --   declined; will do at home if needed.    Moist Heat Location  --  Ultrasound   Ultrasound Location  Rt prox mid to lateral thigh     Ultrasound Parameters  100%, 1.3 w/cm2, 8 min     Ultrasound Goals  Pain   tightness     Manual Therapy   Manual therapy comments  I strip of sensitive skin tape applied to lateral quad (prox) with 20% stretch; perpendicular strip applied to area of fascial adhesions - to decompress tissue, decrease pain, improve proprioception and mobility.     Soft tissue mobilization  IASTM to Rt quad (mostly lateral) to decrease fascial restrictions;  TPR to Rt prox quad/ TFL with active contract/relax    Myofascial Release  MFR to Rt mid-lateral quad              PT Education - 01/22/18 0839    Education Details  Kta[employer] info    Person(s) Educated  Patient    Methods  Explanation    Comprehension  Verbalized understanding          PT Long Term Goals - 01/15/18 0717      PT LONG TERM GOAL #1   Title  Decrease LE radicular symptoms by 50-75% with patient reporting intermittent LE symptoms decreased in frequency, intensity and duration 02/26/18    Time  12    Period  Weeks    Status  Revised      PT  LONG TERM GOAL #2   Title  Increase trunk and LE mobility and ROM to WFL's throughout and painfree 02/26/18    Time  12    Period  Weeks    Status  Revised      PT LONG TERM GOAL #3   Title  Improve core stability and strength allowing patient to participate in 20-30 min of lumbar stabilization exercises without pain 02/26/18    Time  12    Period  Weeks    Status  Revised      PT LONG TERM GOAL #4   Title  Patient to demonstrate and verbalize correct ergonomics and body mechanics for functional activities at work and home 02/26/18    Time  12    Period  Weeks    Status  Revised      PT LONG TERM GOAL #5   Title  Independent in HEP 02/26/18    Time  12    Period  Weeks    Status  Revised      PT LONG TERM GOAL #6   Title  Improve FOTO to </= 36% limitation 02/26/18    Time  12    Period  Weeks    Status  Revised            Plan - 01/22/18 1937    Clinical Impression Statement  Pt reported reduction in pain/ tightness with exercises today, especially the prone quad stretch and opp arm/leg exercise.  Pt reported further reduction with US/manual therapy.  Trial of Rock tape to prox lateral thigh applied to decompress tissue and pain, to improve mobility.    Pt has partially met LGT #1 with 50% reduction in symptoms and is making gradual progress towards remaining goals.     Rehab Potential  Good    Clinical Impairments Affecting Rehab Potential  chronic nature of complaints     PT Frequency  2x / week    PT Duration  6 weeks    PT Treatment/Interventions  Patient/family education;ADLs/Self Care Home Management;Cryotherapy;Electrical Stimulation;Iontophoresis 15m/ml Dexamethasone;Moist Heat;Ultrasound;Dry needling;Manual techniques;Neuromuscular re-education;Functional mobility training;Therapeutic  activities;Therapeutic exercise    PT Next Visit Plan  assess response to tape.  continue progressive strengthening of core and LE stretches.     Consulted and Agree with Plan of Care   Patient       Patient will benefit from skilled therapeutic intervention in order to improve the following deficits and impairments:  Postural dysfunction, Improper body mechanics, Pain, Increased fascial restricitons, Increased muscle spasms, Decreased mobility, Decreased range of motion, Decreased strength, Decreased activity tolerance  Visit Diagnosis: Radiculopathy, lumbar region  Other symptoms and signs involving the musculoskeletal system  Abnormal posture     Problem List Patient Active Problem List   Diagnosis Date Noted  . OSA (obstructive sleep apnea) 03/03/2015  . Benign essential HTN 03/03/2015  . Hypothyroidism 02/14/2009  . HYPERLIPIDEMIA 02/14/2009  . DEPRESSION 02/14/2009  . ACUTE PHARYNGITIS 02/14/2009  . GERD 02/14/2009  . TRAUMATIC AMP OTH FINGER WITHOUT MENTION COMP 07/09/2008   Kerin Perna, PTA 01/22/18 8:39 AM  [facility] [place], [place], [address] Phone: [phone]   Fax:  [phone]  Name: Kahiau Schewe MRN: [id number] Date of Birth: [birth year]

## 2018-01-26 ENCOUNTER — Ambulatory Visit: Payer: BLUE CROSS/BLUE SHIELD | Admitting: Physical Therapy

## 2018-01-26 DIAGNOSIS — M5416 Radiculopathy, lumbar region: Secondary | ICD-10-CM

## 2018-01-26 DIAGNOSIS — R293 Abnormal posture: Secondary | ICD-10-CM

## 2018-01-26 DIAGNOSIS — R29898 Other symptoms and signs involving the musculoskeletal system: Secondary | ICD-10-CM | POA: Diagnosis not present

## 2018-01-26 NOTE — Therapy (Addendum)
[facility] [place] [address] [place], [place], [address] Phone: [phone]   Fax:  [phone]  Physical Therapy Treatment  Patient Details  Name: Shane Blackburn MRN: [id number] Date of Birth: June 17, 1970 Referring Provider (PT): Dr. Denyse Amass    Encounter Date: 01/26/2018  PT End of Session - 01/26/18 0807    Visit Number  12    Number of Visits  21    Date for PT Re-Evaluation  02/26/18    PT Start Time  0803    PT Stop Time  0905    PT Time Calculation (min)  62 min    Activity Tolerance  Patient tolerated treatment well    Behavior During Therapy  [facility] for tasks assessed/performed       Past Medical History:  Diagnosis Date  . Benign essential HTN 03/03/2015  . Depression   . Dyslipidemia   . GERD (gastroesophageal reflux disease)   . Hypertension   . Hyperthyroidism    now hypothyroidism  . OSA (obstructive sleep apnea) 03/03/2015    Past Surgical History:  Procedure Laterality Date  . FINGER SURGERY  2010   finger tip  . WISDOM TOOTH EXTRACTION  1996    There were no vitals filed for this visit.  Subjective Assessment - 01/26/18 0808    Subjective  Pt reports he had increased discomfort in thigh where tape was applied, which he attributed to the tape application.  He left it on for 3 days and had discomfort with removal  (pink area where part of tape was removed). He does feel better than Monday.     Patient Stated Goals  reduce pain in both legs; prevent recurrent problems - core strengthening     Currently in Pain?  Yes    Pain Score  2     Pain Location  Leg    Pain Orientation  Right;Proximal;Lateral    Pain Descriptors / Indicators  Tightness;Aching    Aggravating Factors   prolonged walking    Pain Relieving Factors  sitting, laying down         [facility] PT Assessment - 01/26/18 0001      Assessment   Medical Diagnosis  Lumbar radiculopathy     Referring Provider (PT)  Dr. Denyse Amass     Onset  Date/Surgical Date  04/01/17    Hand Dominance  Right    Next MD Visit  PRN                    [facility] Adult PT Treatment/Exercise - 01/26/18 0001      Lumbar Exercises: Stretches   Hip Flexor Stretch  Right;3 reps;30 seconds   seated, leg back, arm overhead    Quad Stretch  Right;Left;3 reps;30 seconds   prone with strap - noodle under distal knee    ITB Stretch  Right;2 reps;30 seconds    Piriformis Stretch  Right;Left;3 reps;30 seconds      Lumbar Exercises: Aerobic   Tread Mill  2.5 mph x 5 min      Lumbar Exercises: Prone   Opposite Arm/Leg Raise  Right arm/Left leg;Left arm/Right leg;10 reps;4 seconds      Moist Heat Therapy   Number Minutes Moist Heat  20 Minutes    Moist Heat Location  Knee;Hip;Lumbar Spine   Rt thigh     Electrical Stimulation   Electrical Stimulation Location  anterior Rt thigh quads     Electrical Stimulation Action  IFC  Electrical Stimulation Parameters  to tolerance    Electrical Stimulation Goals  Pain;Tone      Manual Therapy   Manual therapy comments  patient supine     Soft tissue mobilization  deep tissue work through the Conservation officer, historic buildings [place] Dry Needling - 01/26/18 1247    Consent Given?  Yes    Muscles Treated Lower Body  --   Rt    Tensor Fascia Lata Response  Palpable increased muscle length;Twitch response elicited    Quadriceps Response  Palpable increased muscle length;Twitch response elicited                PT Long Term Goals - 01/15/18 0717      PT LONG TERM GOAL #1   Title  Decrease LE radicular symptoms by 50-75% with patient reporting intermittent LE symptoms decreased in frequency, intensity and duration 02/26/18    Time  12    Period  Weeks    Status  Revised      PT LONG TERM GOAL #2   Title  Increase trunk and LE mobility and ROM to WFL's throughout and painfree 02/26/18    Time  12    Period  Weeks    Status  Revised      PT LONG TERM GOAL #3   Title  Improve core  stability and strength allowing patient to participate in 20-30 min of lumbar stabilization exercises without pain 02/26/18    Time  12    Period  Weeks    Status  Revised      PT LONG TERM GOAL #4   Title  Patient to demonstrate and verbalize correct ergonomics and body mechanics for functional activities at work and home 02/26/18    Time  12    Period  Weeks    Status  Revised      PT LONG TERM GOAL #5   Title  Independent in HEP 02/26/18    Time  12    Period  Weeks    Status  Revised      PT LONG TERM GOAL #6   Title  Improve FOTO to </= 36% limitation 02/26/18    Time  12    Period  Weeks    Status  Revised            Plan - 01/26/18 1610    Clinical Impression Statement  Pt had limited tolerance to kinesiotape application and removal.  Pt had some increase in discomfort to Rt thigh with prone exercises; reduced with seated hip flexor stretch.  Continued tightness in Rt proximal lateral quad; good response to DN (provided by Corlis Leak, PT).  Pt making gradual progress towards remaining goals.     Rehab Potential  Good    Clinical Impairments Affecting Rehab Potential  chronic nature of complaints     PT Frequency  2x / week    PT Duration  6 weeks    PT Treatment/Interventions  Patient/family education;ADLs/Self Care Home Management;Cryotherapy;Electrical Stimulation;Iontophoresis 4mg /ml Dexamethasone;Moist Heat;Ultrasound;Dry needling;Manual techniques;Neuromuscular re-education;Functional mobility training;Therapeutic activities;Therapeutic exercise    PT Next Visit Plan  continue progressive strengthening of core and LE stretches.        Patient will benefit from skilled therapeutic intervention in order to improve the following deficits and impairments:  Postural dysfunction, Improper body mechanics, Pain, Increased fascial restricitons, Increased muscle spasms, Decreased mobility, Decreased range of motion, Decreased strength, Decreased activity tolerance  Visit  Diagnosis:  Radiculopathy, lumbar region  Other symptoms and signs involving the musculoskeletal system  Abnormal posture     Problem List Patient Active Problem List   Diagnosis Date Noted  . OSA (obstructive sleep apnea) 03/03/2015  . Benign essential HTN 03/03/2015  . Hypothyroidism 02/14/2009  . HYPERLIPIDEMIA 02/14/2009  . DEPRESSION 02/14/2009  . ACUTE PHARYNGITIS 02/14/2009  . GERD 02/14/2009  . TRAUMATIC AMP OTH FINGER WITHOUT MENTION COMP 07/09/2008   Mayer Camel, PTA 01/26/18 1:36 PM   Celyn P. Leonor Liv PT, MPH 01/26/18 1:36 PM    [facility] [place] [address] [place], [place], [address] Phone: [phone]   Fax:  [phone]  Name: Shane Blackburn MRN: [id number] Date of Birth: 12/21/1970

## 2018-01-29 ENCOUNTER — Ambulatory Visit (INDEPENDENT_AMBULATORY_CARE_PROVIDER_SITE_OTHER): Payer: BLUE CROSS/BLUE SHIELD

## 2018-01-29 ENCOUNTER — Ambulatory Visit (INDEPENDENT_AMBULATORY_CARE_PROVIDER_SITE_OTHER): Payer: BLUE CROSS/BLUE SHIELD | Admitting: Family Medicine

## 2018-01-29 ENCOUNTER — Ambulatory Visit: Payer: BLUE CROSS/BLUE SHIELD | Admitting: Physical Therapy

## 2018-01-29 VITALS — BP 128/72 | HR 77 | Ht 69.0 in | Wt 165.0 lb

## 2018-01-29 DIAGNOSIS — R293 Abnormal posture: Secondary | ICD-10-CM

## 2018-01-29 DIAGNOSIS — M25551 Pain in right hip: Secondary | ICD-10-CM | POA: Diagnosis not present

## 2018-01-29 DIAGNOSIS — R29898 Other symptoms and signs involving the musculoskeletal system: Secondary | ICD-10-CM

## 2018-01-29 DIAGNOSIS — M79651 Pain in right thigh: Secondary | ICD-10-CM

## 2018-01-29 DIAGNOSIS — M5416 Radiculopathy, lumbar region: Secondary | ICD-10-CM

## 2018-01-29 NOTE — Therapy (Addendum)
[place], [place], [address] Phone: [phone]   Fax:  [phone]  Physical Therapy Treatment  Patient Details  Name: Shane Blackburn MRN: [id number] Date of Birth: 07-04-1970 Referring Provider (PT): Dr. Georgina Snell    Encounter Date: 01/29/2018  PT End of Session - 01/29/18 0846    Visit Number  13    Number of Visits  21    Date for PT Re-Evaluation  02/26/18    PT Start Time  0806    PT Stop Time  0902    PT Time Calculation (min)  56 min       Past Medical History:  Diagnosis Date  . Benign essential HTN 03/03/2015  . Depression   . Dyslipidemia   . GERD (gastroesophageal reflux disease)   . Hypertension   . Hyperthyroidism    now hypothyroidism  . OSA (obstructive sleep apnea) 03/03/2015    Past Surgical History:  Procedure Laterality Date  . FINGER SURGERY  2010   finger tip  . WISDOM TOOTH EXTRACTION  1996    There were no vitals filed for this visit.  Subjective Assessment - 01/29/18 0808    Subjective  "Thursday night I didn't feel too great. Friday I got better as day went on.  Saturday I felt descent.  Sunday I felt the worst, 5-6/10. "    Pt reports he went and visited MD prior to therapy session.  He had an [place], without discovery of anything.  MD would like possible xray, MRI, nerve conduction study.      Patient Stated Goals  reduce pain in both legs; prevent recurrent problems - core strengthening     Currently in Pain?  Yes    Pain Score  4     Pain Location  Leg    Pain Orientation  Right;Proximal         [facility] PT Assessment - 01/29/18 0001      Assessment   Medical Diagnosis  Lumbar radiculopathy     Referring Provider (PT)  Dr. Georgina Snell     Onset Date/Surgical Date  04/01/17    Hand Dominance  Right    Next MD Visit  PRN       Palpation   SI assessment    Rt ASIS lower than Lt. (point tender in Rt PSIS)       [facility] Adult PT Treatment/Exercise - 01/29/18  0001      Lumbar Exercises: Stretches   Hip Flexor Stretch  Left;2 reps;Right;4 reps;30 seconds    Prone on Elbows Stretch  2 reps;10 seconds    Quad Stretch  Right;Left;3 reps;30 seconds   prone with strap - noodle under distal knee    Quad Stretch Limitations  pain in Rt low back with LLE stretch    Piriformis Stretch  Right;Left;3 reps;30 seconds    Piriformis Stretch Limitations  limited ability for LLE, trialed multiple positions    Gastroc Stretch  --      Lumbar Exercises: Aerobic   Tread Mill  2.3 mph x 5 min      Lumbar Exercises: Prone   Opposite Arm/Leg Raise  Right arm/Left leg;Left arm/Right leg;4 seconds;10 reps      Moist Heat Therapy   Number Minutes Moist Heat  15 Minutes    Moist Heat Location  Knee;Hip;Lumbar Spine   Rt thigh     Electrical Stimulation   Electrical Stimulation Location  Rt/Lt QL     Electrical  Stimulation Action  IFC    Electrical Stimulation Parameters   to pt tolerance x 15 min     Electrical Stimulation Goals  Pain;Tone      Manual Therapy   Manual therapy comments  pt prone    Soft tissue mobilization  STM to Rt/Lt QL (point tender )    Myofascial Release  MFR to Lt lumbar back musculature.     Muscle Energy Technique  MET to correct Rt ant rotated ilium (in supine)                  PT Long Term Goals - 01/15/18 0717      PT LONG TERM GOAL #1   Title  Decrease LE radicular symptoms by 50-75% with patient reporting intermittent LE symptoms decreased in frequency, intensity and duration 02/26/18    Time  12    Period  Weeks    Status  Revised      PT LONG TERM GOAL #2   Title  Increase trunk and LE mobility and ROM to WFL's throughout and painfree 02/26/18    Time  12    Period  Weeks    Status  Revised      PT LONG TERM GOAL #3   Title  Improve core stability and strength allowing patient to participate in 20-30 min of lumbar stabilization exercises without pain 02/26/18    Time  12    Period  Weeks    Status   Revised      PT LONG TERM GOAL #4   Title  Patient to demonstrate and verbalize correct ergonomics and body mechanics for functional activities at work and home 02/26/18    Time  12    Period  Weeks    Status  Revised      PT LONG TERM GOAL #5   Title  Independent in HEP 02/26/18    Time  12    Period  Weeks    Status  Revised      PT LONG TERM GOAL #6   Title  Improve FOTO to </= 36% limitation 02/26/18    Time  12    Period  Weeks    Status  Revised            Plan - 01/29/18 1001    Clinical Impression Statement  Pt demonstrated increased tightness in Lt piriformis and QL; pt has difficulty tolerating stretches to these areas.  Pt point tender in bilat QL with manual therapy in prone.  Pt reported reduction of pain by 2 points with exercise and manual therapy and further reduction with estim at end of session.  Pt reported (after completion of session) that he could feel the increased tingling down his Rt leg "everytime the (estim) machine cycled through".  Per MD note and pt, pt to stop therapy now.  Pt will be having further testing done.     Rehab Potential  Good    Clinical Impairments Affecting Rehab Potential  chronic nature of complaints     PT Frequency  2x / week    PT Duration  6 weeks    PT Treatment/Interventions  Patient/family education;ADLs/Self Care Home Management;Cryotherapy;Electrical Stimulation;Iontophoresis 78m/ml Dexamethasone;Moist Heat;Ultrasound;Dry needling;Manual techniques;Neuromuscular re-education;Functional mobility training;Therapeutic activities;Therapeutic exercise    PT Next Visit Plan  spoke to supervising PT; will hold therapy while pt has further testing .  If pt doesn't return by 02/26/18, will d/c.     PT Home Exercise Plan  continue  HEP as given in previous session.     Consulted and Agree with Plan of Care  Patient       Patient will benefit from skilled therapeutic intervention in order to improve the following deficits and  impairments:  Postural dysfunction, Improper body mechanics, Pain, Increased fascial restricitons, Increased muscle spasms, Decreased mobility, Decreased range of motion, Decreased strength, Decreased activity tolerance  Visit Diagnosis: Radiculopathy, lumbar region  Other symptoms and signs involving the musculoskeletal system  Abnormal posture     Problem List Patient Active Problem List   Diagnosis Date Noted  . OSA (obstructive sleep apnea) 03/03/2015  . Benign essential HTN 03/03/2015  . Hypothyroidism 02/14/2009  . HYPERLIPIDEMIA 02/14/2009  . DEPRESSION 02/14/2009  . ACUTE PHARYNGITIS 02/14/2009  . GERD 02/14/2009  . TRAUMATIC AMP OTH FINGER WITHOUT MENTION COMP 07/09/2008   Kerin Perna, PTA 01/29/18 10:12 AM  [place], [place], [address] Phone: [phone]   Fax:  [phone]  Name: Shane Blackburn MRN: [id number] Date of Birth: June 09, 1970  PHYSICAL THERAPY DISCHARGE SUMMARY  Visits from Start of Care: 13  Current functional level related to goals / functional outcomes: See progress note for discharge status    Remaining deficits: Continued intermittent pain/discomfort    Education / Equipment: HEP  Plan: Patient agrees to discharge.  Patient goals were partially met. Patient is being discharged due to being pleased with the current functional level.  ?????    Celyn P. Helene Kelp PT, MPH 02/28/18 3:09 PM

## 2018-01-29 NOTE — Patient Instructions (Signed)
Thank you for coming in today. Assuming xray looks normal lets proceed with the nerve study.  You should hear soon about the study.  I will keep you updated.   Meralgia Paresthetica  the name of the condition I am worried about.   No need for further PT at this time.

## 2018-01-29 NOTE — Progress Notes (Signed)
Shane Blackburn is a 47 y.o. male who presents to [facility] today for continued pain right anterior thigh to lateral thigh.  Symptoms present ongoing now for several months.  Patient had left leg radicular pain improved with epidural steroid injection but continues to have right thigh pain.  He had multiple sessions of physical therapy with temporary but not lasting benefit.  He had dry needling stretching and strengthening.  He continues to note anterior to lateral thigh pain worse with activity somewhat better with rest.  He does not wear tight pants or a tight belt.  He denies any injury to the anterior aspect of his thigh.  He denies significant hip pain.     ROS:  As above  Exam:  BP 128/72   Pulse 77   Ht 5\' 9"  (1.753 m)   Wt 165 lb (74.8 kg)   BMI 24.37 kg/m  General: Well Developed, well nourished, and in no acute distress.  Neuro/Psych: Alert and oriented x3, extra-ocular muscles intact, able to move all 4 extremities, sensation grossly intact. Skin: Warm and dry, no rashes noted.  Respiratory: Not using accessory muscles, speaking in full sentences, trachea midline.  Cardiovascular: Pulses palpable, no extremity edema. Abdomen: Does not appear distended. MSK:  Right hip normal-appearing normal motion not particularly tender. Pressure at ASIS somewhat reproduces the pain. Resisted hip flexion also somewhat reproduces the pain. Pulses capillary refill and sensation are intact distally.    Lab and Radiology Results No results found for this or any previous visit (from the past 72 hour(s)). Dg Hip Unilat With Pelvis 2-3 Views Right  Result Date: 01/29/2018 CLINICAL DATA:  Right hip pain for several months. EXAM: DG HIP (WITH OR WITHOUT PELVIS) 2-3V RIGHT COMPARISON:  None. FINDINGS: There is no evidence of hip fracture or dislocation. There is no evidence of arthropathy or other focal bone abnormality. IMPRESSION: Negative.  Electronically Signed   By: Lupita Raider, M.D.   On: 01/29/2018 09:35   Dg Femur, Min 2 Views Right  Result Date: 01/29/2018 CLINICAL DATA:  Right leg pain for several months. EXAM: RIGHT FEMUR 2 VIEWS COMPARISON:  None. FINDINGS: There is no evidence of fracture or other focal bone lesions. Soft tissues are unremarkable. IMPRESSION: Negative. Electronically Signed   By: Lupita Raider, M.D.   On: 01/29/2018 09:36   I personally (independently) visualized and performed the interpretation of the images attached in this note.    Limited musculoskeletal ultrasound right anterior groin reveals no significant abnormal structure.  Lateral femoral cutaneous nerve identified and pressure over this area minimally increases symptoms but not dramatically.  No neurovascular changes.  Assessment and Plan: 47 y.o. male with  Right anterior thigh pain concern for meralgia paresthetica versus lumbar radicular versus other issue. Metabolic work-up so far unremarkable.  X-rays unremarkable.  Plan for nerve conduction study.  Consider ultrasound-guided nerve injection for meralgia paresthetica in the future.  Additionally consider MRI hip in the future as well.   Orders Placed This Encounter  Procedures  . DG HIP UNILAT WITH PELVIS 2-3 VIEWS RIGHT    Standing Status:   Future    Number of Occurrences:   1    Standing Expiration Date:   04/01/2019    Order Specific Question:   Reason for Exam (SYMPTOM  OR DIAGNOSIS REQUIRED)    Answer:   eval right hip and thigh pain    Order Specific Question:   Preferred imaging location?  Answer:   [facility]    Order Specific Question:   Radiology Contrast Protocol - do NOT remove file path    Answer:   \\[email or web address].[email or web address]  . DG FEMUR, MIN 2 VIEWS RIGHT    Standing Status:   Future    Number of Occurrences:   1    Standing Expiration Date:   04/01/2019    Order Specific Question:   Reason for Exam (SYMPTOM  OR  DIAGNOSIS REQUIRED)    Answer:   eval right thigh pain    Order Specific Question:   Preferred imaging location?    Answer:   [facility]    Order Specific Question:   Radiology Contrast Protocol - do NOT remove file path    Answer:   \\[email or web address]\[email or web address]s.pdf  . NCV with EMG(electromyography)    Standing Status:   Future    Standing Expiration Date:   01/30/2019    Scheduling Instructions:     Right leg pain concern myalgia paresthetica versus lumbar radicular    Order Specific Question:   Where should this test be performed?    Answer:   other   No orders of the defined types were placed in this encounter.   Historical information moved to improve visibility of documentation.  Past Medical History:  Diagnosis Date  . Benign essential HTN 03/03/2015  . Depression   . Dyslipidemia   . GERD (gastroesophageal reflux disease)   . Hypertension   . Hyperthyroidism    now hypothyroidism  . OSA (obstructive sleep apnea) 03/03/2015   Past Surgical History:  Procedure Laterality Date  . FINGER SURGERY  2010   finger tip  . WISDOM TOOTH EXTRACTION  1996   Social History   Tobacco Use  . Smoking status: Never Smoker  . Smokeless tobacco: Never Used  Substance Use Topics  . Alcohol use: Not on file   family history includes CVA in his father; Diabetes in his mother; Hypertension in his father and mother.  Medications: Current Outpatient Medications  Medication Sig Dispense Refill  . levothyroxine (SYNTHROID, LEVOTHROID) 137 MCG tablet Take 112 mcg by mouth daily before breakfast.     . lisinopril (PRINIVIL,ZESTRIL) 20 MG tablet Take 20 mg by mouth daily.     No current facility-administered medications for this visit.    Allergies  Allergen Reactions  . Norvasc [Amlodipine Besylate] Swelling    swelling      Discussed warning signs or symptoms. Please see discharge instructions. Patient expresses understanding.

## 2018-02-02 ENCOUNTER — Encounter: Payer: BLUE CROSS/BLUE SHIELD | Admitting: Rehabilitative and Restorative Service Providers"

## 2018-04-04 DIAGNOSIS — M79604 Pain in right leg: Secondary | ICD-10-CM | POA: Diagnosis not present

## 2018-04-24 ENCOUNTER — Telehealth: Payer: Self-pay | Admitting: Family Medicine

## 2018-04-24 NOTE — Telephone Encounter (Signed)
EMG/nerve conduction study results received. This was a normal study with no significant abnormalities.  This does not mean there is nothing wrong with the nerves but reduces the chances of significantly pinched nerve in your leg.

## 2018-05-14 ENCOUNTER — Emergency Department (INDEPENDENT_AMBULATORY_CARE_PROVIDER_SITE_OTHER)
Admission: EM | Admit: 2018-05-14 | Discharge: 2018-05-14 | Disposition: A | Discharge status: Hospice | Payer: BLUE CROSS/BLUE SHIELD | Source: Home / Self Care

## 2018-05-14 ENCOUNTER — Encounter: Payer: Self-pay | Admitting: *Deleted

## 2018-05-14 DIAGNOSIS — M47812 Spondylosis without myelopathy or radiculopathy, cervical region: Secondary | ICD-10-CM | POA: Diagnosis not present

## 2018-05-14 DIAGNOSIS — Z823 Family history of stroke: Secondary | ICD-10-CM | POA: Diagnosis not present

## 2018-05-14 DIAGNOSIS — I499 Cardiac arrhythmia, unspecified: Secondary | ICD-10-CM | POA: Diagnosis not present

## 2018-05-14 DIAGNOSIS — G458 Other transient cerebral ischemic attacks and related syndromes: Secondary | ICD-10-CM | POA: Diagnosis not present

## 2018-05-14 DIAGNOSIS — G459 Transient cerebral ischemic attack, unspecified: Secondary | ICD-10-CM | POA: Diagnosis not present

## 2018-05-14 DIAGNOSIS — I10 Essential (primary) hypertension: Secondary | ICD-10-CM

## 2018-05-14 DIAGNOSIS — R202 Paresthesia of skin: Secondary | ICD-10-CM | POA: Diagnosis not present

## 2018-05-14 DIAGNOSIS — R200 Anesthesia of skin: Secondary | ICD-10-CM

## 2018-05-14 DIAGNOSIS — E785 Hyperlipidemia, unspecified: Secondary | ICD-10-CM | POA: Diagnosis not present

## 2018-05-14 DIAGNOSIS — I498 Other specified cardiac arrhythmias: Secondary | ICD-10-CM | POA: Diagnosis not present

## 2018-05-14 DIAGNOSIS — E890 Postprocedural hypothyroidism: Secondary | ICD-10-CM

## 2018-05-14 DIAGNOSIS — M5124 Other intervertebral disc displacement, thoracic region: Secondary | ICD-10-CM | POA: Diagnosis not present

## 2018-05-14 NOTE — ED Provider Notes (Signed)
[facility]    CSN: [id number] Arrival date & time: 05/14/18  0807     History   Chief Complaint Chief Complaint  Patient presents with  . Numbness    HPI Shane Blackburn is a 48 y.o. male.   HPI 48 year old white male patient who comes in with a history of having awakened about 2 AM with a "numbness" sensation in his left side.  He felt it from the biceps area down to his hand, from mid thigh down to his foot, and on the left cheekbone.  He was able to go back to sleep but it was still there when he woke up this morning.  He came in here and said he had eaten breakfast but felt a tiny bit nauseous.  He has not had these symptoms in the past.  He does have a history of hypertension controlled with lisinopril, and of controlled hypothyroidism.  Those are the only 2 medicines he takes.  He does not smoke.  He works at [employer].  He is married.  The patient does have some problems with a disc in his back causing right leg symptoms.  He has been treated with injections and therapy for that.  He is listed as having OSA, but apparently that resolved after losing 30 pounds 3 years ago.  His father had a series of strokes, his first 1 in his 88s and finally dying from 1 in his upper 76s.  Mother had diabetes. Past Medical History:  Diagnosis Date  . Benign essential HTN 03/03/2015  . Depression   . Dyslipidemia   . GERD (gastroesophageal reflux disease)   . Hypertension   . Hyperthyroidism    now hypothyroidism  . OSA (obstructive sleep apnea) 03/03/2015    Patient Active Problem List   Diagnosis Date Noted  . OSA (obstructive sleep apnea) 03/03/2015  . Benign essential HTN 03/03/2015  . Hypothyroidism 02/14/2009  . HYPERLIPIDEMIA 02/14/2009  . DEPRESSION 02/14/2009  . ACUTE PHARYNGITIS 02/14/2009  . GERD 02/14/2009  . 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 FINGER SURGERY  2010   finger tip    . WISDOM TOOTH EXTRACTION  1996       Home Medications    Prior to Admission medications   Medication Sig Start Date End Date Taking? Authorizing Provider  levothyroxine (SYNTHROID, LEVOTHROID) 137 MCG tablet Take 112 mcg by mouth daily before breakfast.    Yes [provider]  lisinopril (PRINIVIL,ZESTRIL) 20 MG tablet Take 20 mg by mouth daily.   Yes [provider]    Family History Family History  Problem Relation Age of Onset  . Hypertension Mother   . Diabetes Mother   . Hypertension Father   . CVA Father     Social History Social History   Tobacco Use  . Smoking status: Never Smoker  . Smokeless tobacco: Never Used  Substance Use Topics  . Alcohol use: Not on file  . Drug use: No   Married, lives with his wife, works at [employer].  His wife can probably come and drive him somewhere if needed.  Allergies   Norvasc [amlodipine besylate]   Review of Systems Review of Systems Constitutional: Unremarkable HEENT: No visual or hearing or tongue problems Cardiovascular: Unremarkable Respiratory: Unremarkable GI: Minimal nausea this morning GU: Unremarkable Musculoskeletal: Unremarkable Neurologic numbness as noted above.  History of lumbar disc disease with right leg symptoms Psychiatric: Unremarkable  Physical Exam Triage Vital Signs ED Triage Vitals [05/14/18 0828]  Enc Vitals Group     BP 132/82     Pulse Rate 88     Resp 16     Temp 97.8 F (36.6 C)     Temp Source Oral     SpO2 98 %     Weight 163 lb (73.9 kg)     Height 5\' 9"  (1.753 m)     Head Circumference      Peak Flow      Pain Score 0     Pain Loc      Pain Edu?      Excl. in GC?    No data found.  Updated Vital Signs BP 132/82 (BP Location: Right Arm)   Pulse 88   Temp 97.8 F (36.6 C) (Oral)   Resp 16   Ht 5\' 9"  (1.753 m)   Wt 73.9 kg   SpO2 98%   BMI 24.07 kg/m   Visual Acuity Right Eye Distance:   Left Eye Distance:   Bilateral Distance:    Right  Eye Near:   Left Eye Near:    Bilateral Near:     Physical Exam Alert and oriented.  TMs normal.  Eyes PRL.  EOMs intact.  Fundi benign.  Throat clear.  Motion of tongue normal.  Uvula symmetrical.  Neck supple without significant nodes.  Chest is clear to auscultation.  Heart regular without murmurs.  Abdomen soft without mass or tenderness.  Rapid alternating movements normal in hands.  Hand strength and arm strength good.  Gait normal.  Heel toe gait normal.  Deep tendon reflexes symmetrical with the exception of the right knee jerk is diminished compared to the left.  Skin warm and dry.  Mild dysesthesia the lateral third of his left hand. UC Treatments / Results  Labs (all labs ordered are listed, but only abnormal results are displayed) Labs Reviewed - No data to display  EKG None  Radiology No results found.  Procedures Procedures (including critical care time)  Medications Ordered in UC Medications - No data to display  Initial Impression / Assessment and Plan / UC Course  I have reviewed the triage vital signs and the nursing notes.  Pertinent labs & imaging results that were available during my care of the patient were reviewed by me and considered in my medical decision making (see chart for details).     Left-sided numbness, rule out a TIA/CVA.  With the inability to get a stat MRI here today, we are asking him to go on over to [facility]  Since it has been over 7 hours and he is able with no obvious findings I feel it is safe for him to be transported by his wife directly there in her motor vehicle. Final Clinical Impressions(s) / UC Diagnoses   Final diagnoses:  Numbness  Postablative hypothyroidism  Family history of CVA  Benign essential HTN     Discharge Instructions     We are unable to get a stat MRI here today, so you will need to go to the emergency room at [facility] which you have chosen.  With a normal examination, I believe  it is safe for your wife to carry you directly there by motor vehicle.      ED Prescriptions    None     Controlled Substance Prescriptions [place] Controlled Substance Registry consulted? No   Peyton Najjar, MD 05/14/18 [phone]

## 2018-05-14 NOTE — Discharge Instructions (Signed)
We are unable to get a stat MRI here today, so you will need to go to the emergency room at [facility] which you have chosen.  With a normal examination, I believe it is safe for your wife to carry you directly there by motor vehicle.

## 2018-05-14 NOTE — ED Triage Notes (Signed)
Pt awoke this am approx 0230 with left arm numbness, left leg "pins and needles", and left face "tight" sensation. Denies headache but became nauseous shortly after arriving here. Pt's father had a hx of multiple strokes, the first at 50 years old. Negative arm drift, no slurred speech, grips are equal, and no facial ptosis.

## 2018-05-22 DIAGNOSIS — R202 Paresthesia of skin: Secondary | ICD-10-CM | POA: Diagnosis not present

## 2018-05-22 DIAGNOSIS — E890 Postprocedural hypothyroidism: Secondary | ICD-10-CM | POA: Diagnosis not present

## 2018-06-13 DIAGNOSIS — Z13220 Encounter for screening for lipoid disorders: Secondary | ICD-10-CM | POA: Diagnosis not present

## 2018-06-13 DIAGNOSIS — Z0000 Encounter for general adult medical examination without abnormal findings: Secondary | ICD-10-CM | POA: Diagnosis not present

## 2018-06-13 DIAGNOSIS — E782 Mixed hyperlipidemia: Secondary | ICD-10-CM | POA: Diagnosis not present

## 2018-06-19 DIAGNOSIS — I10 Essential (primary) hypertension: Secondary | ICD-10-CM | POA: Diagnosis not present

## 2018-06-19 DIAGNOSIS — E890 Postprocedural hypothyroidism: Secondary | ICD-10-CM | POA: Diagnosis not present

## 2018-06-19 DIAGNOSIS — Z0000 Encounter for general adult medical examination without abnormal findings: Secondary | ICD-10-CM | POA: Diagnosis not present

## 2018-07-24 DIAGNOSIS — K649 Unspecified hemorrhoids: Secondary | ICD-10-CM | POA: Diagnosis not present

## 2018-07-24 DIAGNOSIS — I10 Essential (primary) hypertension: Secondary | ICD-10-CM | POA: Diagnosis not present

## 2018-07-24 DIAGNOSIS — R05 Cough: Secondary | ICD-10-CM | POA: Diagnosis not present

## 2018-09-05 DIAGNOSIS — J392 Other diseases of pharynx: Secondary | ICD-10-CM | POA: Diagnosis not present

## 2018-09-05 DIAGNOSIS — Z923 Personal history of irradiation: Secondary | ICD-10-CM | POA: Diagnosis not present

## 2018-09-06 ENCOUNTER — Other Ambulatory Visit: Payer: Self-pay | Admitting: Family Medicine

## 2018-09-06 ENCOUNTER — Ambulatory Visit (INDEPENDENT_AMBULATORY_CARE_PROVIDER_SITE_OTHER): Payer: BC Managed Care – PPO

## 2018-09-06 ENCOUNTER — Other Ambulatory Visit: Payer: Self-pay

## 2018-09-06 DIAGNOSIS — Z923 Personal history of irradiation: Secondary | ICD-10-CM | POA: Diagnosis not present

## 2018-09-06 DIAGNOSIS — R05 Cough: Secondary | ICD-10-CM | POA: Diagnosis not present

## 2018-10-03 DIAGNOSIS — R05 Cough: Secondary | ICD-10-CM | POA: Diagnosis not present

## 2018-10-03 DIAGNOSIS — R070 Pain in throat: Secondary | ICD-10-CM | POA: Diagnosis not present

## 2018-11-14 DIAGNOSIS — R05 Cough: Secondary | ICD-10-CM | POA: Diagnosis not present

## 2019-01-23 DIAGNOSIS — R05 Cough: Secondary | ICD-10-CM | POA: Diagnosis not present

## 2019-01-31 DIAGNOSIS — E291 Testicular hypofunction: Secondary | ICD-10-CM | POA: Diagnosis not present

## 2019-01-31 DIAGNOSIS — E559 Vitamin D deficiency, unspecified: Secondary | ICD-10-CM | POA: Diagnosis not present

## 2019-01-31 DIAGNOSIS — R5383 Other fatigue: Secondary | ICD-10-CM | POA: Diagnosis not present

## 2019-01-31 DIAGNOSIS — R200 Anesthesia of skin: Secondary | ICD-10-CM | POA: Diagnosis not present

## 2019-01-31 DIAGNOSIS — I10 Essential (primary) hypertension: Secondary | ICD-10-CM | POA: Diagnosis not present

## 2019-01-31 DIAGNOSIS — M549 Dorsalgia, unspecified: Secondary | ICD-10-CM | POA: Diagnosis not present

## 2019-01-31 DIAGNOSIS — E538 Deficiency of other specified B group vitamins: Secondary | ICD-10-CM | POA: Diagnosis not present

## 2019-01-31 DIAGNOSIS — E039 Hypothyroidism, unspecified: Secondary | ICD-10-CM | POA: Diagnosis not present

## 2019-03-11 DIAGNOSIS — E039 Hypothyroidism, unspecified: Secondary | ICD-10-CM | POA: Diagnosis not present

## 2019-03-11 DIAGNOSIS — E291 Testicular hypofunction: Secondary | ICD-10-CM | POA: Diagnosis not present

## 2019-03-11 DIAGNOSIS — M549 Dorsalgia, unspecified: Secondary | ICD-10-CM | POA: Diagnosis not present

## 2019-03-11 DIAGNOSIS — R5383 Other fatigue: Secondary | ICD-10-CM | POA: Diagnosis not present

## 2019-05-27 DIAGNOSIS — R5383 Other fatigue: Secondary | ICD-10-CM | POA: Diagnosis not present

## 2019-05-27 DIAGNOSIS — E538 Deficiency of other specified B group vitamins: Secondary | ICD-10-CM | POA: Diagnosis not present

## 2019-05-27 DIAGNOSIS — E039 Hypothyroidism, unspecified: Secondary | ICD-10-CM | POA: Diagnosis not present

## 2019-05-27 DIAGNOSIS — E559 Vitamin D deficiency, unspecified: Secondary | ICD-10-CM | POA: Diagnosis not present

## 2019-05-27 DIAGNOSIS — E291 Testicular hypofunction: Secondary | ICD-10-CM | POA: Diagnosis not present

## 2019-06-10 DIAGNOSIS — I10 Essential (primary) hypertension: Secondary | ICD-10-CM | POA: Diagnosis not present

## 2019-06-10 DIAGNOSIS — M549 Dorsalgia, unspecified: Secondary | ICD-10-CM | POA: Diagnosis not present

## 2019-06-10 DIAGNOSIS — E612 Magnesium deficiency: Secondary | ICD-10-CM | POA: Diagnosis not present

## 2019-06-10 DIAGNOSIS — E291 Testicular hypofunction: Secondary | ICD-10-CM | POA: Diagnosis not present

## 2019-06-20 DIAGNOSIS — Z0000 Encounter for general adult medical examination without abnormal findings: Secondary | ICD-10-CM | POA: Diagnosis not present

## 2019-08-02 DIAGNOSIS — K6389 Other specified diseases of intestine: Secondary | ICD-10-CM | POA: Diagnosis not present

## 2019-08-02 DIAGNOSIS — Z1211 Encounter for screening for malignant neoplasm of colon: Secondary | ICD-10-CM | POA: Diagnosis not present

## 2019-08-02 DIAGNOSIS — K635 Polyp of colon: Secondary | ICD-10-CM | POA: Diagnosis not present

## 2019-08-09 DIAGNOSIS — G5622 Lesion of ulnar nerve, left upper limb: Secondary | ICD-10-CM | POA: Diagnosis not present

## 2019-11-26 DIAGNOSIS — E039 Hypothyroidism, unspecified: Secondary | ICD-10-CM | POA: Diagnosis not present

## 2019-11-26 DIAGNOSIS — E559 Vitamin D deficiency, unspecified: Secondary | ICD-10-CM | POA: Diagnosis not present

## 2019-11-26 DIAGNOSIS — E291 Testicular hypofunction: Secondary | ICD-10-CM | POA: Diagnosis not present

## 2019-11-26 DIAGNOSIS — E612 Magnesium deficiency: Secondary | ICD-10-CM | POA: Diagnosis not present

## 2019-11-26 DIAGNOSIS — E538 Deficiency of other specified B group vitamins: Secondary | ICD-10-CM | POA: Diagnosis not present

## 2019-12-09 DIAGNOSIS — E291 Testicular hypofunction: Secondary | ICD-10-CM | POA: Diagnosis not present

## 2019-12-09 DIAGNOSIS — I10 Essential (primary) hypertension: Secondary | ICD-10-CM | POA: Diagnosis not present

## 2019-12-09 DIAGNOSIS — E60 Dietary zinc deficiency: Secondary | ICD-10-CM | POA: Diagnosis not present

## 2019-12-09 DIAGNOSIS — M549 Dorsalgia, unspecified: Secondary | ICD-10-CM | POA: Diagnosis not present

## 2020-02-04 DIAGNOSIS — R030 Elevated blood-pressure reading, without diagnosis of hypertension: Secondary | ICD-10-CM | POA: Diagnosis not present

## 2020-02-04 DIAGNOSIS — Z23 Encounter for immunization: Secondary | ICD-10-CM | POA: Diagnosis not present

## 2020-05-25 DIAGNOSIS — R5383 Other fatigue: Secondary | ICD-10-CM | POA: Diagnosis not present

## 2020-05-25 DIAGNOSIS — E538 Deficiency of other specified B group vitamins: Secondary | ICD-10-CM | POA: Diagnosis not present

## 2020-05-25 DIAGNOSIS — E559 Vitamin D deficiency, unspecified: Secondary | ICD-10-CM | POA: Diagnosis not present

## 2020-05-25 DIAGNOSIS — Z789 Other specified health status: Secondary | ICD-10-CM | POA: Diagnosis not present

## 2020-05-25 DIAGNOSIS — I10 Essential (primary) hypertension: Secondary | ICD-10-CM | POA: Diagnosis not present

## 2020-05-25 DIAGNOSIS — M549 Dorsalgia, unspecified: Secondary | ICD-10-CM | POA: Diagnosis not present

## 2020-05-25 DIAGNOSIS — E039 Hypothyroidism, unspecified: Secondary | ICD-10-CM | POA: Diagnosis not present

## 2020-05-25 DIAGNOSIS — E291 Testicular hypofunction: Secondary | ICD-10-CM | POA: Diagnosis not present

## 2020-06-08 DIAGNOSIS — E60 Dietary zinc deficiency: Secondary | ICD-10-CM | POA: Diagnosis not present

## 2020-06-08 DIAGNOSIS — M549 Dorsalgia, unspecified: Secondary | ICD-10-CM | POA: Diagnosis not present

## 2020-06-08 DIAGNOSIS — E291 Testicular hypofunction: Secondary | ICD-10-CM | POA: Diagnosis not present

## 2020-06-08 DIAGNOSIS — I10 Essential (primary) hypertension: Secondary | ICD-10-CM | POA: Diagnosis not present

## 2020-06-26 DIAGNOSIS — Z125 Encounter for screening for malignant neoplasm of prostate: Secondary | ICD-10-CM | POA: Diagnosis not present

## 2020-06-26 DIAGNOSIS — Z833 Family history of diabetes mellitus: Secondary | ICD-10-CM | POA: Diagnosis not present

## 2020-06-26 DIAGNOSIS — Z13220 Encounter for screening for lipoid disorders: Secondary | ICD-10-CM | POA: Diagnosis not present

## 2020-06-26 DIAGNOSIS — Z0000 Encounter for general adult medical examination without abnormal findings: Secondary | ICD-10-CM | POA: Diagnosis not present

## 2020-08-14 IMAGING — MR MR LUMBAR SPINE W/O CM
4 of 5 series · 26 of 48 positions shown · non-contrast
Comparison: 05/29/2017

CLINICAL DATA: Lumbar radiculopathy. Anterior left thigh and knee
pain with numbness. Right anterior and lateral thigh pain.

EXAM:
MRI LUMBAR SPINE WITHOUT CONTRAST
TECHNIQUE: Multiplanar, multisequence MR imaging of the lumbar spine was
performed. No intravenous contrast was administered.

[Series 2: T2 · sagittal · 4.0mm · 0.81mm/px · 6 of 15 slices shown (1 of 2)]
[im 1/15]
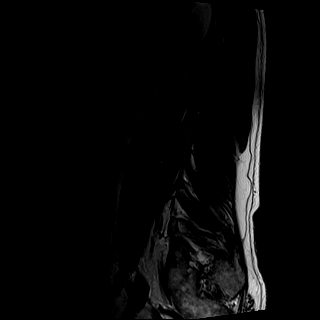
[im 3/15]
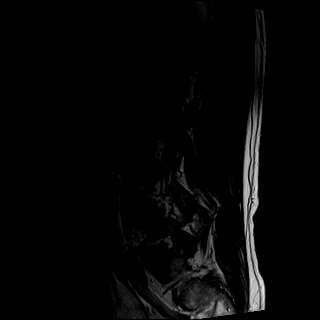
[im 6/15]
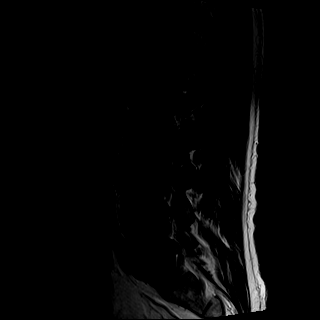
[im 9/15]
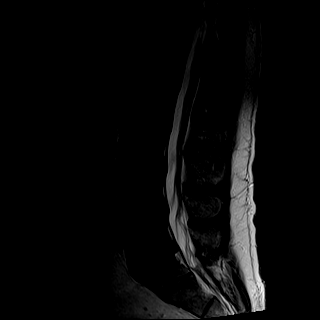
[im 12/15]
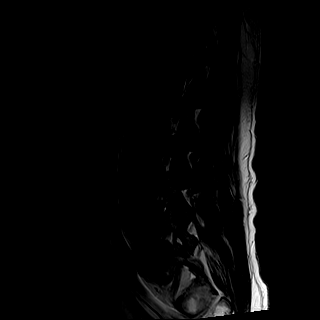
[im 15/15]
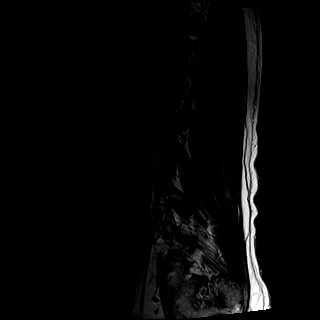

[Series 3: T1 · sagittal · 4.0mm · 0.41mm/px · 5 of 15 slices shown (1 of 2)]
[im 1/15]
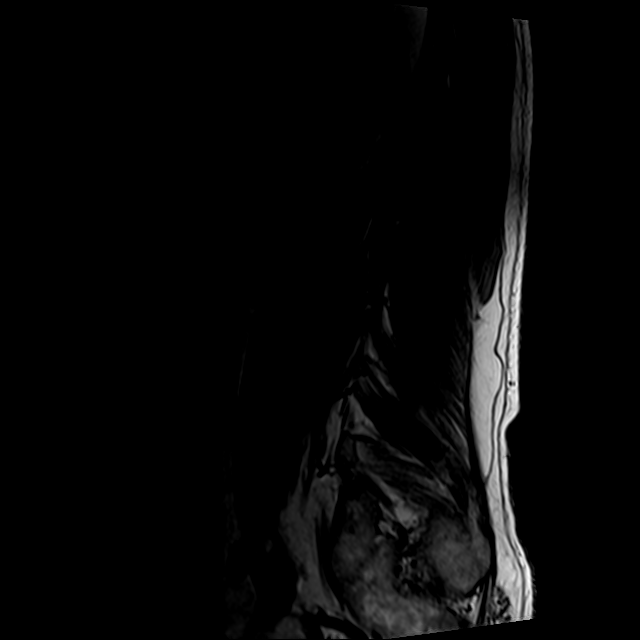
[im 4/15]
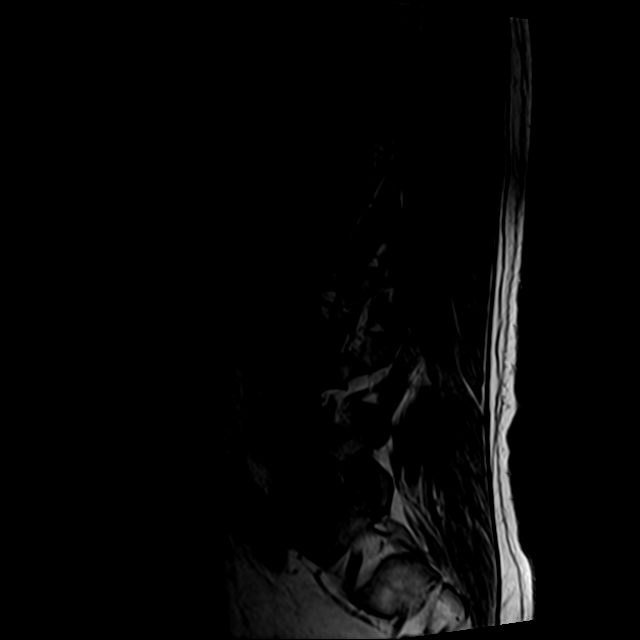
[im 8/15]
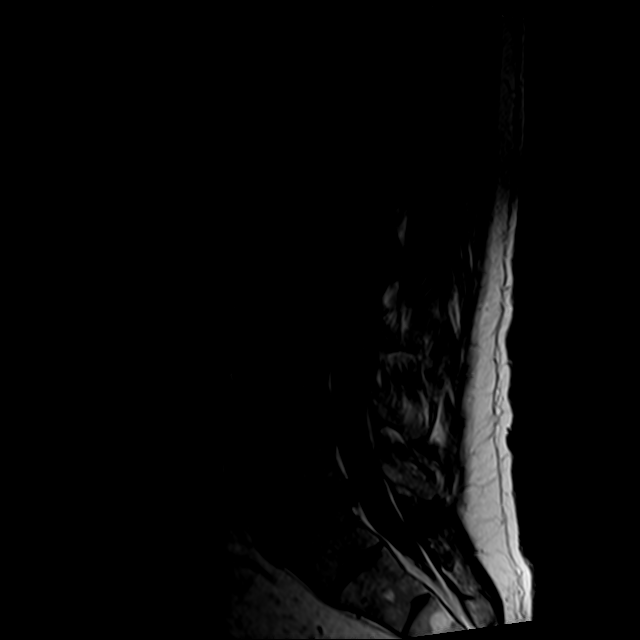
[im 11/15]
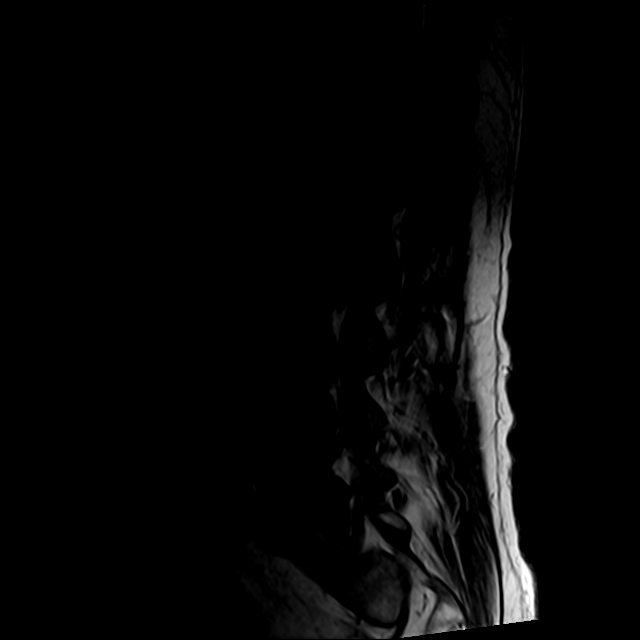
[im 15/15]
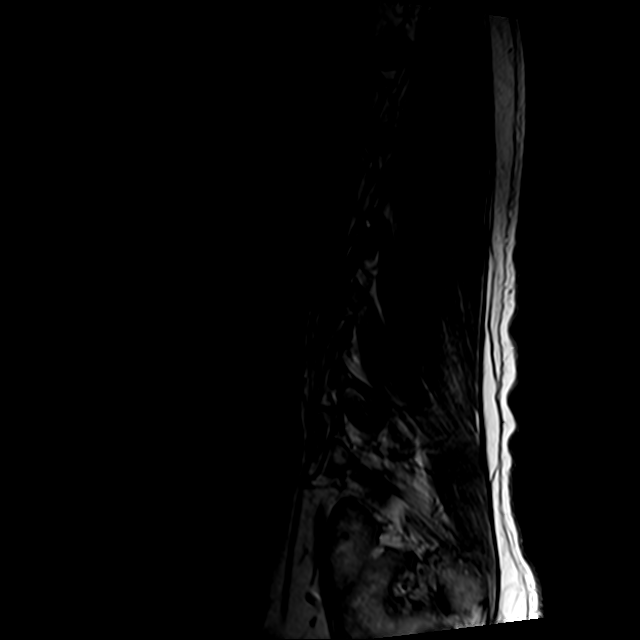

[Series 5: T2 · axial · 4.0mm · 0.78mm/px · z∈[-91,+129]mm · 10 of 43 slices shown (2 of 2)]
[im 3/43]
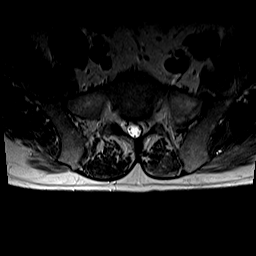
[im 6/43]
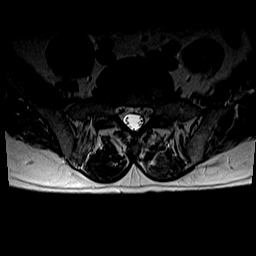
[im 9/43]
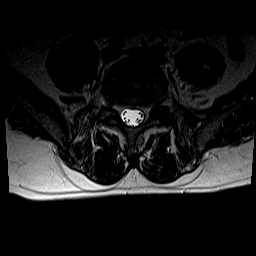
[im 15/43]
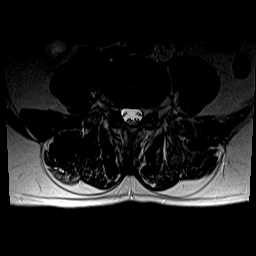
[im 20/43]
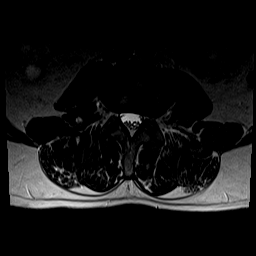
[im 23/43]
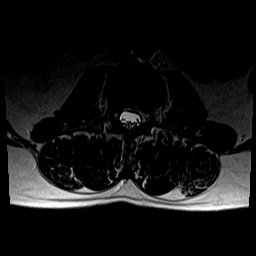
[im 26/43]
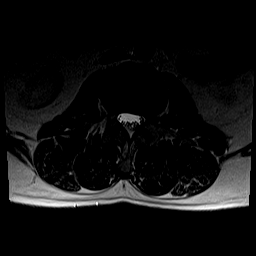
[im 31/43]
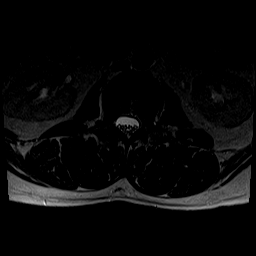
[im 37/43]
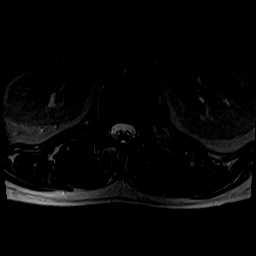
[im 43/43]
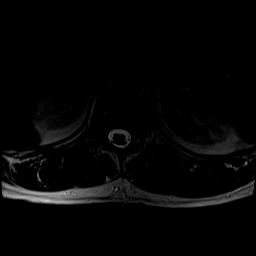

[Series 6: T1 · axial · 4.0mm · 0.39mm/px · z∈[-91,+99]mm · 5 of 43 slices shown (2 of 2)]
[im 3/43]
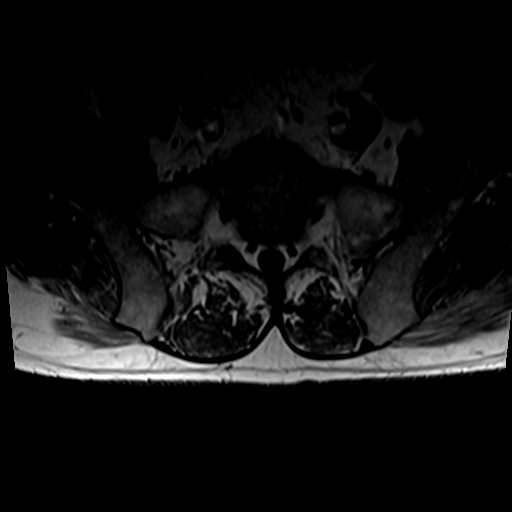
[im 6/43]
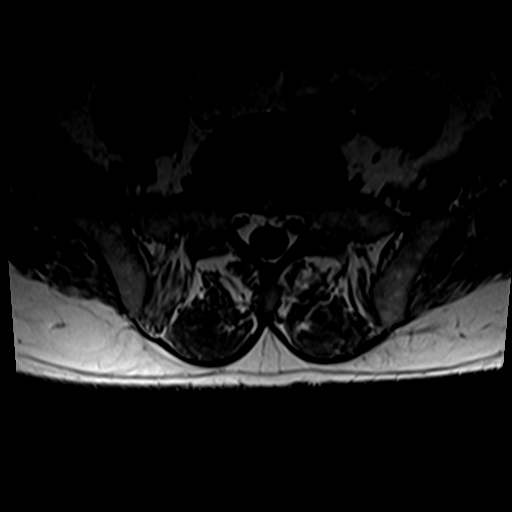
[im 9/43]
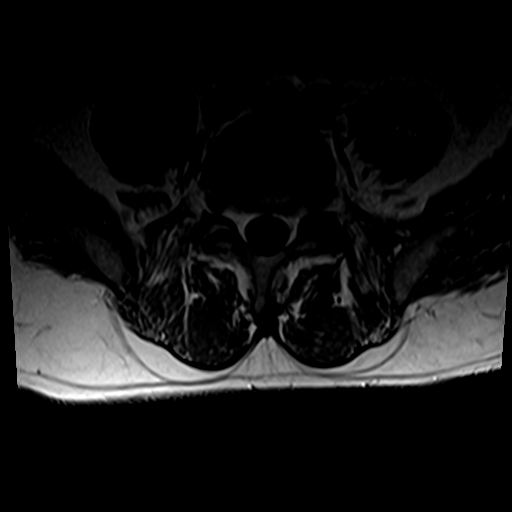
[im 23/43]
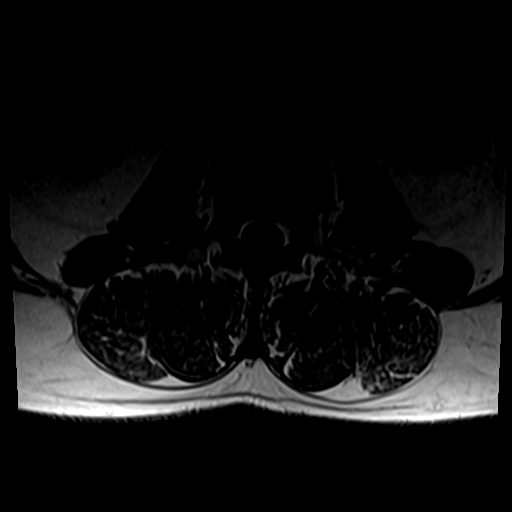
[im 37/43]
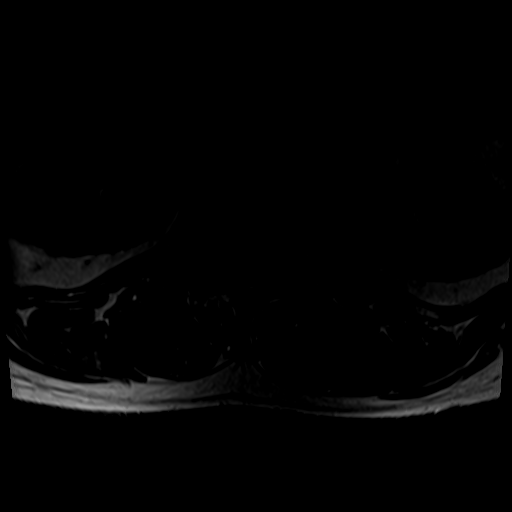

[26 of 48 positions shown; findings below may reference images not displayed]

FINDINGS: Segmentation:  Standard.  Minimally prominent S1-2 disc.

Alignment:  Unchanged trace retrolisthesis of L5 on S1.

Vertebrae: No fracture, suspicious osseous lesion, or significant
marrow edema.

Conus medullaris and cauda equina: Conus extends to the L1-2 level.
Conus and cauda equina appear normal.

Paraspinal and other soft tissues: Unremarkable.

Disc levels:

Disc desiccation from L2-3 to L5-S1 with only at most mild disc
space narrowing.

T12-L1 and L1-2: Negative.

L2-3: Left foraminal to extraforaminal disc protrusion with annular
fissure results in mild spinal stenosis and potential impingement of
the extraforaminal left L2 nerve, unchanged. Patent spinal canal and
right neural foramen.

L3-4: A left foraminal disc extrusion is larger than on the prior
study and results in moderate left neural foraminal stenosis and
likely impingement of the left L3 nerve root. Patent spinal canal
and right neural foramen.

L4-5: The large right foraminal and extraforaminal disc extrusion on
the prior study has decreased in size. There is a residual right
foraminal and extraforaminal disc protrusion with annular fissure
which results in moderate residual right neural foraminal stenosis,
potentially still affecting the right L4 nerve. Mild right lateral
recess narrowing is unchanged. Mild left neural foraminal stenosis
due to mild disc bulging is also unchanged. No significant spinal
stenosis.

L5-S1: Mild circumferential disc bulging and a central to left
subarticular disc extrusion with minimal caudal migration result in
moderate left lateral recess stenosis and moderate left neural
foraminal stenosis with potential left L5 and left S1 nerve root
impingement, unchanged. Disc also may contact the right S1 nerve
root in the canal as before. No significant generalized spinal
stenosis.
IMPRESSION: 1. Increased size of left foraminal disc extrusion at L3-4 with
increased foraminal stenosis and left L3 nerve root impingement.
2. Decreased size of right foraminal/extraforaminal disc extrusion
at L4-5.
3. Unchanged L5-S1 disc extrusion and disc bulging with moderate
left lateral recess and left neural foraminal stenosis.
4. Unchanged left foraminal/extraforaminal disc protrusion at L2-3.

## 2020-09-09 DIAGNOSIS — R61 Generalized hyperhidrosis: Secondary | ICD-10-CM | POA: Diagnosis not present

## 2020-09-09 DIAGNOSIS — E890 Postprocedural hypothyroidism: Secondary | ICD-10-CM | POA: Diagnosis not present

## 2020-09-27 DIAGNOSIS — R0981 Nasal congestion: Secondary | ICD-10-CM | POA: Diagnosis not present

## 2020-09-27 DIAGNOSIS — R051 Acute cough: Secondary | ICD-10-CM | POA: Diagnosis not present

## 2020-09-27 DIAGNOSIS — R519 Headache, unspecified: Secondary | ICD-10-CM | POA: Diagnosis not present

## 2020-09-27 DIAGNOSIS — U071 COVID-19: Secondary | ICD-10-CM | POA: Diagnosis not present

## 2020-12-22 DIAGNOSIS — E890 Postprocedural hypothyroidism: Secondary | ICD-10-CM | POA: Diagnosis not present

## 2020-12-22 DIAGNOSIS — E291 Testicular hypofunction: Secondary | ICD-10-CM | POA: Diagnosis not present

## 2020-12-23 DIAGNOSIS — E291 Testicular hypofunction: Secondary | ICD-10-CM | POA: Diagnosis not present

## 2020-12-23 DIAGNOSIS — E890 Postprocedural hypothyroidism: Secondary | ICD-10-CM | POA: Diagnosis not present

## 2021-01-22 DIAGNOSIS — R5383 Other fatigue: Secondary | ICD-10-CM | POA: Diagnosis not present

## 2021-01-22 DIAGNOSIS — E291 Testicular hypofunction: Secondary | ICD-10-CM | POA: Diagnosis not present

## 2021-01-22 DIAGNOSIS — I10 Essential (primary) hypertension: Secondary | ICD-10-CM | POA: Diagnosis not present

## 2021-01-22 DIAGNOSIS — E538 Deficiency of other specified B group vitamins: Secondary | ICD-10-CM | POA: Diagnosis not present

## 2021-01-22 DIAGNOSIS — Z131 Encounter for screening for diabetes mellitus: Secondary | ICD-10-CM | POA: Diagnosis not present

## 2021-01-22 DIAGNOSIS — E559 Vitamin D deficiency, unspecified: Secondary | ICD-10-CM | POA: Diagnosis not present

## 2021-01-22 DIAGNOSIS — E612 Magnesium deficiency: Secondary | ICD-10-CM | POA: Diagnosis not present

## 2021-01-22 DIAGNOSIS — M549 Dorsalgia, unspecified: Secondary | ICD-10-CM | POA: Diagnosis not present

## 2021-02-05 DIAGNOSIS — E60 Dietary zinc deficiency: Secondary | ICD-10-CM | POA: Diagnosis not present

## 2021-02-05 DIAGNOSIS — I10 Essential (primary) hypertension: Secondary | ICD-10-CM | POA: Diagnosis not present

## 2021-02-05 DIAGNOSIS — M549 Dorsalgia, unspecified: Secondary | ICD-10-CM | POA: Diagnosis not present

## 2021-02-05 DIAGNOSIS — E291 Testicular hypofunction: Secondary | ICD-10-CM | POA: Diagnosis not present

## 2021-02-18 DIAGNOSIS — E291 Testicular hypofunction: Secondary | ICD-10-CM | POA: Diagnosis not present

## 2021-02-18 DIAGNOSIS — E890 Postprocedural hypothyroidism: Secondary | ICD-10-CM | POA: Diagnosis not present

## 2021-02-18 DIAGNOSIS — R202 Paresthesia of skin: Secondary | ICD-10-CM | POA: Diagnosis not present

## 2021-03-26 DIAGNOSIS — E291 Testicular hypofunction: Secondary | ICD-10-CM | POA: Diagnosis not present

## 2021-03-26 DIAGNOSIS — R202 Paresthesia of skin: Secondary | ICD-10-CM | POA: Diagnosis not present

## 2021-03-26 DIAGNOSIS — E890 Postprocedural hypothyroidism: Secondary | ICD-10-CM | POA: Diagnosis not present

## 2021-04-20 DIAGNOSIS — R202 Paresthesia of skin: Secondary | ICD-10-CM | POA: Diagnosis not present

## 2021-05-29 DIAGNOSIS — R202 Paresthesia of skin: Secondary | ICD-10-CM | POA: Diagnosis not present

## 2021-05-29 NOTE — Progress Notes (Signed)
 05/29/2021  Detric Scalisi  [id number]  Carlin Dale Gull, MD   Chief complaint:  Patient is here for the following No chief complaint on file. Shane Blackburn  HPI: 51 year old male states when he feels like he is getting warm right before he sweats starts getting a burning paresthesia once he does start to exercise or anything else or starts to sweat it goes away.   Patient Active Problem List  Diagnosis  . Pins and needles sensation      Current Outpatient Medications:  .  levothyroxine (SYNTHROID) 125 MCG tablet, Take 1 tablet (125 mcg total) by mouth daily., Disp: 90 tablet, Rfl: 3   @TOBACCO @  REVIEW OF SYSTEMS:   Review of Systems   There were no vitals filed for this visit. There is no height or weight on file to calculate BMI. Data Unavailable  PHYSICAL EXAM:   GEN: Well appearing, no apparent distress  NECK: supple  SKIN: Normal.  NEURO:  Alert and Oriented times three, Normal speech and language. Patient gives a coherent history and has a normal train of thought. Recent and remote memory appear to be normal.  Normal attention and concentration. CRANIAL NERVES:  Extraocular motions are intact no diplopia.  Normal facial symmetry and sensation.  Uvula and palate upward and midline.  Sensory normal to light touch throughout  Motor moves all extremities well, no abnormal movements. No evidence of tremor.  GAIT: Normal walking. Gait is normal not wide-based.   Impression: 51 year old male with intermittent paresthesias with temperature change.  Today nerve conduction studies appear to be normal.  Discussed the differential such as small fiber neuropathy.  Discussed possibly repeating studies if symptoms become more persistent.    Plan: Follow-up with referring physician.  (R20.2) Pins and needles sensation - Plan: NCV/EMG/Ultrasound   Call or go to ER if any acute changes.  Safety and side effects discussed. All question answered.    I agree the documentation is accurate  and complete.  Electronically signed by: Camellia Krystal Custard, MD 05/29/2021 4:03 PM    Electronically signed by: Camellia Krystal Custard, MD 05/29/21 [phone]

## 2021-06-29 DIAGNOSIS — Z125 Encounter for screening for malignant neoplasm of prostate: Secondary | ICD-10-CM | POA: Diagnosis not present

## 2021-06-29 DIAGNOSIS — E890 Postprocedural hypothyroidism: Secondary | ICD-10-CM | POA: Diagnosis not present

## 2021-06-29 DIAGNOSIS — Z13220 Encounter for screening for lipoid disorders: Secondary | ICD-10-CM | POA: Diagnosis not present

## 2021-06-29 DIAGNOSIS — Z0000 Encounter for general adult medical examination without abnormal findings: Secondary | ICD-10-CM | POA: Diagnosis not present

## 2021-06-29 DIAGNOSIS — D696 Thrombocytopenia, unspecified: Secondary | ICD-10-CM | POA: Diagnosis not present

## 2021-07-05 DIAGNOSIS — Z0000 Encounter for general adult medical examination without abnormal findings: Secondary | ICD-10-CM | POA: Diagnosis not present

## 2021-07-19 DIAGNOSIS — E538 Deficiency of other specified B group vitamins: Secondary | ICD-10-CM | POA: Diagnosis not present

## 2021-07-19 DIAGNOSIS — E559 Vitamin D deficiency, unspecified: Secondary | ICD-10-CM | POA: Diagnosis not present

## 2021-07-19 DIAGNOSIS — E612 Magnesium deficiency: Secondary | ICD-10-CM | POA: Diagnosis not present

## 2021-07-19 DIAGNOSIS — Z7409 Other reduced mobility: Secondary | ICD-10-CM | POA: Diagnosis not present

## 2021-07-19 DIAGNOSIS — R5383 Other fatigue: Secondary | ICD-10-CM | POA: Diagnosis not present

## 2021-07-19 DIAGNOSIS — I10 Essential (primary) hypertension: Secondary | ICD-10-CM | POA: Diagnosis not present

## 2021-07-19 DIAGNOSIS — E291 Testicular hypofunction: Secondary | ICD-10-CM | POA: Diagnosis not present

## 2021-07-19 DIAGNOSIS — E039 Hypothyroidism, unspecified: Secondary | ICD-10-CM | POA: Diagnosis not present

## 2021-07-19 DIAGNOSIS — Z131 Encounter for screening for diabetes mellitus: Secondary | ICD-10-CM | POA: Diagnosis not present

## 2021-08-04 DIAGNOSIS — E291 Testicular hypofunction: Secondary | ICD-10-CM | POA: Diagnosis not present

## 2021-08-04 DIAGNOSIS — E60 Dietary zinc deficiency: Secondary | ICD-10-CM | POA: Diagnosis not present

## 2021-08-04 DIAGNOSIS — I10 Essential (primary) hypertension: Secondary | ICD-10-CM | POA: Diagnosis not present

## 2021-08-04 DIAGNOSIS — M549 Dorsalgia, unspecified: Secondary | ICD-10-CM | POA: Diagnosis not present

## 2022-02-21 DIAGNOSIS — R030 Elevated blood-pressure reading, without diagnosis of hypertension: Secondary | ICD-10-CM | POA: Diagnosis not present

## 2022-02-21 DIAGNOSIS — R42 Dizziness and giddiness: Secondary | ICD-10-CM | POA: Diagnosis not present

## 2022-06-13 DIAGNOSIS — R030 Elevated blood-pressure reading, without diagnosis of hypertension: Secondary | ICD-10-CM | POA: Diagnosis not present

## 2022-06-24 DIAGNOSIS — I160 Hypertensive urgency: Secondary | ICD-10-CM | POA: Diagnosis not present

## 2022-06-24 DIAGNOSIS — E785 Hyperlipidemia, unspecified: Secondary | ICD-10-CM | POA: Diagnosis not present

## 2022-06-24 DIAGNOSIS — R509 Fever, unspecified: Secondary | ICD-10-CM | POA: Diagnosis not present

## 2022-06-24 DIAGNOSIS — Z888 Allergy status to other drugs, medicaments and biological substances status: Secondary | ICD-10-CM | POA: Diagnosis not present

## 2022-06-24 DIAGNOSIS — R55 Syncope and collapse: Secondary | ICD-10-CM | POA: Diagnosis not present

## 2022-06-24 DIAGNOSIS — I10 Essential (primary) hypertension: Secondary | ICD-10-CM | POA: Diagnosis not present

## 2022-06-24 DIAGNOSIS — R6883 Chills (without fever): Secondary | ICD-10-CM | POA: Diagnosis not present

## 2022-06-24 DIAGNOSIS — Z882 Allergy status to sulfonamides status: Secondary | ICD-10-CM | POA: Diagnosis not present

## 2022-06-24 DIAGNOSIS — E079 Disorder of thyroid, unspecified: Secondary | ICD-10-CM | POA: Diagnosis not present

## 2022-06-24 DIAGNOSIS — K219 Gastro-esophageal reflux disease without esophagitis: Secondary | ICD-10-CM | POA: Diagnosis not present

## 2022-06-24 DIAGNOSIS — R519 Headache, unspecified: Secondary | ICD-10-CM | POA: Diagnosis not present

## 2022-06-24 DIAGNOSIS — Z79899 Other long term (current) drug therapy: Secondary | ICD-10-CM | POA: Diagnosis not present

## 2022-06-24 DIAGNOSIS — R030 Elevated blood-pressure reading, without diagnosis of hypertension: Secondary | ICD-10-CM | POA: Diagnosis not present

## 2022-06-24 DIAGNOSIS — R42 Dizziness and giddiness: Secondary | ICD-10-CM | POA: Diagnosis not present

## 2022-06-24 NOTE — ED Provider Notes (Signed)
 ------------------------------------------------------------------------------- Attestation signed by Katheryn Rollo Jewels, DO at 07/20/22 2211 Patient was seen by PA or NP and I was not involved in patient's work-up, care plan, treatment or disposition.  I was available for consultation at all points during patient's visit.   -------------------------------------------------------------------------------   [facility]  ED Provider Note  Shane Blackburn 52 y.o. male DOB: 01-Jul-1970 MRN: [id number] History   Chief Complaint  Patient presents with  . Hypertension    Hx of HTN but was taken off meds in past. Recently had high readings and was put back on meds. Took 2 meds within last 24 hrs, began feeling bad with chills/lightheaded and noted bp was elevated higher. 205/116 at triage.   52 year old male with past medical history of hyperlipidemia, hypertension, GERD, thyroid  disease presents to the emergency department for elevated blood pressure with associated chills, diaphoresis, dizziness, near syncope that started about 3 hours ago.  Patient reports that he has been taking irbesartan for blood pressure control as prescribed by his PCP and was instructed to take verapamil which he used to take many years ago for his blood pressure, if blood pressure was not well-controlled.  Patient reports that today he decided to take a dose of verapamil around noon.  Patient reports that he started feeling dizzy and shaky and decided to check his blood pressure at that time and noted it to be very elevated.  Patient reports that symptoms persisted until about 30 minutes ago.  Since eating a Clif bar.  Patient denies headache, vision changes, nausea, focal weakness, chest pain, shortness of breath, palpitations.      Past Medical History:  Diagnosis Date  . Disease of thyroid  gland   . GERD (gastroesophageal reflux disease)   . Hyperlipidemia   . Hypertension      History reviewed. No pertinent surgical history.  Social History   Substance and Sexual Activity  Alcohol Use None   Social History   Tobacco Use  Smoking Status Never  Smokeless Tobacco Not on file   E-Cigarettes  . Vaping Use    . Start Date    . Cartridges/Day    . Quit Date     Social History   Substance and Sexual Activity  Drug Use Not on file         Allergies  Allergen Reactions  . Ace Inhibitors Swelling  . Sulfamethoxazole W/Trimethoprim Swelling    swelling    Discharge Medication List as of 06/24/2022  8:16 PM     CONTINUE these medications which have NOT CHANGED   Details  irbesartan (AVAPRO) 150 MG tablet Take one tablet (150 mg dose) by mouth at bedtime., Historical Med    levothyroxine (SYNTHROID, LEVOTHROID) 137 mcg tablet Take 137 mcg by mouth daily., Until Discontinued, Historical Med    Urea 40 % GEL Apply 1 ampule topically daily., Starting 07/21/2015, Until Discontinued, Normal    verapamil HCl (VERELAN) 180 mg 24 hr capsule Take one capsule (180 mg dose) by mouth at bedtime., Historical Med        Review of Systems   Review of Systems  Constitutional:  Positive for diaphoresis. Negative for chills and fever.  HENT:  Positive for sinus pressure. Negative for ear pain and sore throat.   Eyes:  Negative for pain and visual disturbance.  Respiratory:  Negative for cough and shortness of breath.   Cardiovascular:  Negative for chest pain, palpitations and leg swelling.  Gastrointestinal:  Negative for abdominal pain and  vomiting.  Genitourinary:  Negative for dysuria and hematuria.  Musculoskeletal:  Negative for arthralgias and back pain.  Skin:  Negative for color change and rash.  Neurological:  Positive for dizziness, weakness and light-headedness. Negative for seizures and syncope.  All other systems reviewed and are negative.   Physical Exam   ED Triage Vitals  BP 06/24/22 1711 (!) 205/116  Heart Rate 06/24/22 1711 105   Resp 06/24/22 1711 18  SpO2 06/24/22 1711 99 %  Temp 06/24/22 1714 98.7 F (37.1 C)    Physical Exam  Nursing note and vitals reviewed. Constitutional: He appears well-developed and well-nourished. He does not appear distressed and does not appear ill.  HENT:  Head: Normocephalic and atraumatic.  Eyes: EOM are intact. Conjunctivae are normal. Pupils are equal, round, and reactive to light.  Neck: Normal range of motion. Neck supple.  Cardiovascular: Normal rate, regular rhythm and normal heart sounds.  Pulmonary/Chest: Respiratory effort normal and breath sounds normal. No visible chest trauma.  Musculoskeletal: Normal range of motion.     Cervical back: Normal range of motion and neck supple. no edema.  Neurological: He is alert and oriented to person, place, and time. He has normal speech. Cranial nerves intact II through XII. Sensation intact to light touch, bilateral upper and lower extremities. Strength 5/5 bilateral upper and lower extremities.  Skin: Skin is warm. Skin is dry.  Psychiatric: He has a normal mood and affect. His behavior is normal. Judgment and thought content normal.    ED Course   Lab results:   CBC AND DIFFERENTIAL - Abnormal      Result Value   WBC 9.1     RBC 5.59     HGB 16.5     HCT 46.0     MCV 82 (*)    MCH 29.5     MCHC 35.9     Plt Ct 155     RDW SD 34.5 (*)    MPV 9.1     NRBC% 0.0     NRBC 0.000     NEUTROPHIL % 72.7 (*)    LYMPHOCYTE % 21.9 (*)    MONOCYTE % 4.4     Eosinophil % 0.3 (*)    BASOPHIL % 0.4     IG% 0.300     ABSOLUTE NEUTROPHIL COUNT 6.60     ABSOLUTE LYMPHOCYTE COUNT 2.0     Mono Absolute 0.4     EOS ABSOLUTE 0.0     BASO ABSOLUTE 0.0     IG ABSOLUTE 0.030    COMPREHENSIVE METABOLIC PANEL - Abnormal   Na 136     Potassium 3.9     Cl 100     CO2 26     AGAP 10     Glucose 139 (*)    BUN 16     Creatinine 1.09     Ca 9.4     ALK PHOS 84     T Bili 0.28     Total Protein 7.1     Alb 4.6     GLOBULIN  2.5     ALBUMIN/GLOBULIN RATIO 1.8     BUN/CREAT RATIO 14.7     ALT 15     AST 20     eGFR 81     Comment: Normal GFR (glomerular filtration rate) > 60 mL/min/1.73 meters squared, < 60 may include impaired kidney function. Calculation based on the Chronic Kidney Disease Epidemiology Collaboration (CK-EPI)equation refit without adjustment for race.  GEN5 CARDIAC TROPONIN T (TNT5) BASELINE - Normal   TnT-Gen5 (0hr) 5     Comment: Assay results < 6 ng/L and > 10,000 ng/L are reported as 5 ng/L and 10,001 ng/L respectively to reflect the absolute reportable range of TnTGen5.  An elevated Troponin indicates myocardial damage. Elevated troponin may also be due to pulmonary emboli, aortic dissection, heart failure, trauma, toxins and ischemia in the setting of critical illness.   MAGNESIUM - Normal   Mg 1.8    TSH - Normal   TSH 0.54    LIGHT BLUE TOP  LAVENDER TOP  GOLD SST    Imaging:   XR CHEST AP PORTABLE   Narrative:    TECHNIQUE:  XR CHEST AP PORTABLE  INDICATION: Fever  FINDINGS:  XR CHEST  Unremarkable cardiomediastinal silhouette.   No pulmonary venous congestion. No acute suspicious pulmonary infiltrate identified.  No pneumothorax identified.      Impression:    IMPRESSION:  XR CHEST No acute cardiopulmonary disease identified.  Electronically Signed by: Charlie Patch, MD on 06/24/2022 8:00 PM  CT HEAD WO CONTRAST   Narrative:    INDICATION:    headache and hypertension,  COMPARISON:   None.  TECHNIQUE:    Multiple axial images obtained from the skull base to the vertex without IV contrast  were obtained on  06/24/2022 7:29 PM  FINDINGS:  No evidence of hydrocephalus. No intracranial mass, mass effect or midline shift. No acute infarction evident. No intracranial hemorrhage. Paranasal sinuses: Clear. Mastoid air cells: Clear. Calvarium: Intact.    Impression:    IMPRESSION: No acute intracranial abnormality.  Electronically Signed by: Marinda Fleming, MD on 06/24/2022 7:54 PM    ECG: ECG Results          ECG 12 lead (Final result)  Result time 06/24/22 23:07:19    Final result             Narrative:   Diagnosis Class Borderline Abnormal Acquisition Device D3K Systolic BP 174 Diastolic BP 85 Ventricular Rate 87 Atrial Rate 87 P-R Interval 160 QRS Duration 118 Q-T Interval 370 QTC Calculation(Bazett) 445 Calculated P Axis 77 Calculated R Axis 78 Calculated T Axis 35  Diagnosis Normal sinus rhythm normal axis Incomplete right bundle branch block Borderline ECG No previous ECGs available Floretta Madura ([id number]) on 06/24/2022 11:07:12 PM certifies that he/she has reviewed the ECG tracing and confirms the independent  interpretation is correct.                      HEAR Score History: Mostly low risk features ECG: Normal Age: 65-64 yrs Risk Factors: More than or equal 3 risk factors or history of atherosclerotic disease HEAR Score Total: 3                        Pre-Sedation Procedures  ED Course as of 07/19/22 0053  Rodalyn Tamea Hemming Documentation  Fri Jun 24, 2022  1825 Sodium: 136  1825 Potassium: 3.9  1825 Chloride: 100  1825 CO2: 26  1825 AGAP: 10  1825 Glucose(!): 139  1825 BUN: 16  1825 Creatinine: 1.09  1825 WBC: 9.1  1825 RBC: 5.59  1825 Hemoglobin: 16.5  1825 Neutrophils %(!): 72.7  1825 BP(!): 205/116  1825 Temp: 98.7 F (37.1 C)  1825 Heart Rate: 105  1825 Resp: 18  1825 SpO2: 99 %  1852 BP(!): 174/85  1931 TnT-Gen5 (0hr): 5  1931 BP(!): 152/90  1931  Heart Rate: 89  1931 SpO2: 98 %  1940 TSH: 0.54  1940 Magnesium: 1.8  1940 BP(!): 152/90  2012 BP(!): 159/93  2012 Temp: 98.7 F (37.1 C)  2012 Heart Rate: 76  2012 Resp: 18  2012 SpO2: 98 %  2012 TSH: 0.54  2012 Magnesium: 1.8   Medical Decision Making Given patient's history and physical, the following studies were ordered. Orders Placed This Encounter     XR Chest Ap Portable      CT Head WO Contrast     CBC and Differential     Comprehensive Metabolic Panel     Extra Tubes     Urinalysis W/Micro Reflex Culture - Symptomatic     Gen5 Cardiac Troponin T (TnT5) Baseline Series at: baseline, 1 hour, and 3 hour; Onset of symptoms: Greater than or equal 3 hours     Magnesium     TSH     Obtain repeat ECG 12-lead STAT 30 minutes after initial ECG IF patient is experiencing new, continued or recurrent symptoms Reason: Chest Pain - Precordial Pain     ECG 12 lead     ECG 12 lead     ECG 12 lead     ECG 12 lead     Insert peripheral IV  Differential diagnosis includes but is not limited to medication side effect, dehydration, orthostatics, hypertensive urgency/emergency, cardiac arrhythmia, acs, CVA.  Results were reviewed, see ED course. NO acute lab or imaging abnormalities. EKGs showed no ischemic changes.   Results were discussed with patient who demonstrated understanding.  During ED stay patient remained hemodynamically stable and neurovascularly intact.   Prior to discharge on reexamination, patient was laying comfortably in ED bed, in no acute distress, non toxic appearing, speaking in full sentences.   Patient's presentation, evaluation, and plan was discussed with ED attending/ specialty consultant who agreed with management.   At this point there is no indication for further imaging or lab workup as there is no evidence of life-threatening or surgical process. Based on exam and history they can safely be discharged home. This was discussed with the patient and they feel comfortable going home. Return precautions were extensively discussed and they understand return for any worsening symptoms or failure to improve.     Amount and/or Complexity of Data Reviewed Labs: ordered. Decision-making details documented in ED Course. Radiology: ordered. ECG/medicine tests: ordered.       Provider Communication  Discharge Medication List as of 06/24/2022  8:16 PM       Discharge Medication List as of 06/24/2022  8:16 PM      Discharge Medication List as of 06/24/2022  8:16 PM      Clinical Impression   Final diagnoses:  Hypertensive urgency  Near syncope  Chills (without fever)    ED Disposition     ED Disposition  Discharge   Condition  Stable   Comment  --                 Follow-up Information     Schedule an appointment as soon as possible for a visit  with C Dale Gull, MD.   Specialty: Family Medicine Contact information: [address] [place] [address] [phone]                  Electronically signed by:    Sharma Tamea Peasant, PA 07/19/22 [phone]

## 2022-06-27 DIAGNOSIS — R0981 Nasal congestion: Secondary | ICD-10-CM | POA: Diagnosis not present

## 2022-06-27 DIAGNOSIS — R519 Headache, unspecified: Secondary | ICD-10-CM | POA: Diagnosis not present

## 2022-06-27 DIAGNOSIS — I10 Essential (primary) hypertension: Secondary | ICD-10-CM | POA: Diagnosis not present

## 2022-07-21 DIAGNOSIS — Z13220 Encounter for screening for lipoid disorders: Secondary | ICD-10-CM | POA: Diagnosis not present

## 2022-07-21 DIAGNOSIS — D696 Thrombocytopenia, unspecified: Secondary | ICD-10-CM | POA: Diagnosis not present

## 2022-07-21 DIAGNOSIS — Z0000 Encounter for general adult medical examination without abnormal findings: Secondary | ICD-10-CM | POA: Diagnosis not present

## 2022-07-21 DIAGNOSIS — E890 Postprocedural hypothyroidism: Secondary | ICD-10-CM | POA: Diagnosis not present

## 2022-07-21 DIAGNOSIS — Z23 Encounter for immunization: Secondary | ICD-10-CM | POA: Diagnosis not present

## 2022-07-21 DIAGNOSIS — Z125 Encounter for screening for malignant neoplasm of prostate: Secondary | ICD-10-CM | POA: Diagnosis not present

## 2022-07-25 DIAGNOSIS — R5383 Other fatigue: Secondary | ICD-10-CM | POA: Diagnosis not present

## 2022-07-25 DIAGNOSIS — E039 Hypothyroidism, unspecified: Secondary | ICD-10-CM | POA: Diagnosis not present

## 2022-07-25 DIAGNOSIS — E538 Deficiency of other specified B group vitamins: Secondary | ICD-10-CM | POA: Diagnosis not present

## 2022-07-25 DIAGNOSIS — E291 Testicular hypofunction: Secondary | ICD-10-CM | POA: Diagnosis not present

## 2022-07-25 DIAGNOSIS — E559 Vitamin D deficiency, unspecified: Secondary | ICD-10-CM | POA: Diagnosis not present

## 2022-07-26 DIAGNOSIS — J0100 Acute maxillary sinusitis, unspecified: Secondary | ICD-10-CM | POA: Diagnosis not present

## 2022-08-08 DIAGNOSIS — E291 Testicular hypofunction: Secondary | ICD-10-CM | POA: Diagnosis not present

## 2022-08-08 DIAGNOSIS — E559 Vitamin D deficiency, unspecified: Secondary | ICD-10-CM | POA: Diagnosis not present

## 2022-08-08 DIAGNOSIS — E039 Hypothyroidism, unspecified: Secondary | ICD-10-CM | POA: Diagnosis not present

## 2022-08-08 DIAGNOSIS — R5383 Other fatigue: Secondary | ICD-10-CM | POA: Diagnosis not present

## 2022-08-08 DIAGNOSIS — E538 Deficiency of other specified B group vitamins: Secondary | ICD-10-CM | POA: Diagnosis not present

## 2022-08-08 DIAGNOSIS — I10 Essential (primary) hypertension: Secondary | ICD-10-CM | POA: Diagnosis not present

## 2022-08-08 DIAGNOSIS — M2550 Pain in unspecified joint: Secondary | ICD-10-CM | POA: Diagnosis not present

## 2023-06-08 DIAGNOSIS — R030 Elevated blood-pressure reading, without diagnosis of hypertension: Secondary | ICD-10-CM | POA: Diagnosis not present

## 2023-06-08 DIAGNOSIS — Z6824 Body mass index (BMI) 24.0-24.9, adult: Secondary | ICD-10-CM | POA: Diagnosis not present

## 2023-06-08 DIAGNOSIS — F430 Acute stress reaction: Secondary | ICD-10-CM | POA: Diagnosis not present

## 2023-07-24 DIAGNOSIS — E291 Testicular hypofunction: Secondary | ICD-10-CM | POA: Diagnosis not present

## 2023-07-24 DIAGNOSIS — E612 Magnesium deficiency: Secondary | ICD-10-CM | POA: Diagnosis not present

## 2023-07-24 DIAGNOSIS — Z1321 Encounter for screening for nutritional disorder: Secondary | ICD-10-CM | POA: Diagnosis not present

## 2023-07-24 DIAGNOSIS — Z125 Encounter for screening for malignant neoplasm of prostate: Secondary | ICD-10-CM | POA: Diagnosis not present

## 2023-07-24 DIAGNOSIS — R5383 Other fatigue: Secondary | ICD-10-CM | POA: Diagnosis not present

## 2023-07-24 DIAGNOSIS — E559 Vitamin D deficiency, unspecified: Secondary | ICD-10-CM | POA: Diagnosis not present

## 2023-08-03 DIAGNOSIS — E890 Postprocedural hypothyroidism: Secondary | ICD-10-CM | POA: Diagnosis not present

## 2023-08-03 DIAGNOSIS — H6980 Other specified disorders of Eustachian tube, unspecified ear: Secondary | ICD-10-CM | POA: Diagnosis not present

## 2023-08-03 DIAGNOSIS — Z0000 Encounter for general adult medical examination without abnormal findings: Secondary | ICD-10-CM | POA: Diagnosis not present

## 2023-08-03 DIAGNOSIS — F430 Acute stress reaction: Secondary | ICD-10-CM | POA: Diagnosis not present

## 2023-08-03 DIAGNOSIS — R030 Elevated blood-pressure reading, without diagnosis of hypertension: Secondary | ICD-10-CM | POA: Diagnosis not present

## 2023-08-07 DIAGNOSIS — I10 Essential (primary) hypertension: Secondary | ICD-10-CM | POA: Diagnosis not present

## 2023-08-07 DIAGNOSIS — E291 Testicular hypofunction: Secondary | ICD-10-CM | POA: Diagnosis not present

## 2023-08-07 DIAGNOSIS — R5383 Other fatigue: Secondary | ICD-10-CM | POA: Diagnosis not present

## 2023-08-07 DIAGNOSIS — E039 Hypothyroidism, unspecified: Secondary | ICD-10-CM | POA: Diagnosis not present

## 2023-09-05 DIAGNOSIS — Z6824 Body mass index (BMI) 24.0-24.9, adult: Secondary | ICD-10-CM | POA: Diagnosis not present

## 2023-09-05 DIAGNOSIS — R6883 Chills (without fever): Secondary | ICD-10-CM | POA: Diagnosis not present

## 2023-09-05 DIAGNOSIS — R5381 Other malaise: Secondary | ICD-10-CM | POA: Diagnosis not present

## 2023-09-05 DIAGNOSIS — R0981 Nasal congestion: Secondary | ICD-10-CM | POA: Diagnosis not present

## 2023-09-18 ENCOUNTER — Encounter (INDEPENDENT_AMBULATORY_CARE_PROVIDER_SITE_OTHER): Payer: Self-pay

## 2023-10-30 DIAGNOSIS — R768 Other specified abnormal immunological findings in serum: Secondary | ICD-10-CM | POA: Diagnosis not present

## 2023-11-14 ENCOUNTER — Encounter (INDEPENDENT_AMBULATORY_CARE_PROVIDER_SITE_OTHER): Payer: Self-pay | Admitting: Physician Assistant

## 2023-11-14 ENCOUNTER — Ambulatory Visit (INDEPENDENT_AMBULATORY_CARE_PROVIDER_SITE_OTHER): Admitting: Audiology

## 2023-11-14 ENCOUNTER — Ambulatory Visit (INDEPENDENT_AMBULATORY_CARE_PROVIDER_SITE_OTHER): Admitting: Physician Assistant

## 2023-11-14 VITALS — BP 179/92 | HR 86

## 2023-11-14 DIAGNOSIS — H9390 Unspecified disorder of ear, unspecified ear: Secondary | ICD-10-CM | POA: Diagnosis not present

## 2023-11-14 DIAGNOSIS — R42 Dizziness and giddiness: Secondary | ICD-10-CM | POA: Diagnosis not present

## 2023-11-14 DIAGNOSIS — H6993 Unspecified Eustachian tube disorder, bilateral: Secondary | ICD-10-CM

## 2023-11-14 DIAGNOSIS — H903 Sensorineural hearing loss, bilateral: Secondary | ICD-10-CM

## 2023-11-14 MED ORDER — LORATADINE 10 MG PO TABS: 10.0000 mg | ORAL_TABLET | Freq: Every day | ORAL | 11 refills | Status: AC

## 2023-11-14 MED ORDER — FLUTICASONE PROPIONATE 50 MCG/ACT NA SUSP: 2.0000 | Freq: Every day | NASAL | 6 refills | Status: AC
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
## 2023-11-14 NOTE — Progress Notes (Unsigned)
  [address], [address] [place], [place] [address] [phone]  Audiological Evaluation    Name: Shane Blackburn     DOB:   1970-11-04      MRN:   [id number]                                                                                     Service Date: 11/14/2023     Accompanied by: unaccompanied   Patient comes today after Reyes Cohen, PA-C sent a referral for a hearing evaluation due to concerns with dizziness.   Symptoms Yes Details  Hearing loss  []    Tinnitus  [x]  Both ears- onset mid June  Ear pain/ infections/pressure  [x]  Right ear fullness   Balance problems  [x]  Off balance, maybe feels like in a boat . Typically lasts hours with different intensities when he feels off balance.   Noise exposure history  []    Previous ear surgeries  []    Family history of hearing loss  []    Amplification  []    Other  []      Otoscopy: Right ear: Clear external ear canal and notable landmarks visualized on the tympanic membrane. Left ear:  Clear external ear canal and notable landmarks visualized on the tympanic membrane.  Tympanometry: Right ear: Type A- Normal external ear canal volume with normal middle ear pressure and tympanic membrane compliance. Left ear: Type A- Normal external ear canal volume with normal middle ear pressure and tympanic membrane compliance.    Pure tone Audiometry: Right ear- Normal hearing from [phone] Hz, then moderate presumably sensorineural hearing loss at 8000 Hz. Left ear-  Normal hearing from [phone] Hz, then mild presumably sensorineural hearing loss at 8000 Hz.  Speech Audiometry: Right ear- Speech Reception Threshold (SRT) was obtained at 15 dBHL. Left ear-Speech Reception Threshold (SRT) was obtained at 10 dBHL.   Word Recognition Score Tested using NU-6 (recorded) Right ear: 100% was obtained at a presentation level of 55 dBHL with contralateral masking which is deemed as  excellent. Left ear: 100% was obtained at a presentation  level of 55 dBHL with contralateral masking which is deemed as  excellent.   The hearing test results were completed under headphones and results are deemed to be of good reliability. Test technique:  conventional      Recommendations: Follow up with ENT as scheduled for today. Return for a hearing evaluation if concerns with hearing changes arise or per MD recommendation.   Gardy Montanari MARIE LEROUX-MARTINEZ, AUD

## 2023-11-23 ENCOUNTER — Encounter: Payer: Self-pay | Admitting: Audiology

## 2023-12-06 DIAGNOSIS — E890 Postprocedural hypothyroidism: Secondary | ICD-10-CM | POA: Diagnosis not present

## 2024-01-18 ENCOUNTER — Encounter (INDEPENDENT_AMBULATORY_CARE_PROVIDER_SITE_OTHER): Payer: Self-pay | Admitting: Physician Assistant

## 2024-01-18 ENCOUNTER — Ambulatory Visit (INDEPENDENT_AMBULATORY_CARE_PROVIDER_SITE_OTHER): Admitting: Audiology

## 2024-01-18 ENCOUNTER — Ambulatory Visit (INDEPENDENT_AMBULATORY_CARE_PROVIDER_SITE_OTHER): Admitting: Physician Assistant

## 2024-01-18 VITALS — BP 157/94 | HR 83 | Temp 98.0°F

## 2024-01-18 DIAGNOSIS — Z0110 Encounter for examination of ears and hearing without abnormal findings: Secondary | ICD-10-CM | POA: Diagnosis not present

## 2024-01-18 DIAGNOSIS — H6993 Unspecified Eustachian tube disorder, bilateral: Secondary | ICD-10-CM

## 2024-01-18 DIAGNOSIS — H9391 Unspecified disorder of right ear: Secondary | ICD-10-CM

## 2024-01-18 DIAGNOSIS — R42 Dizziness and giddiness: Secondary | ICD-10-CM

## 2024-01-18 DIAGNOSIS — H6990 Unspecified Eustachian tube disorder, unspecified ear: Secondary | ICD-10-CM

## 2024-01-18 NOTE — Progress Notes (Signed)
 Patient is having BP managed. Patient has white coat syndrome.

## 2024-01-18 NOTE — Progress Notes (Signed)
 Dear Dr. Okey, Here is my assessment for our mutual patient, Shane Blackburn. Thank you for allowing me the opportunity to care for your patient. Please do not hesitate to contact me should you have any other questions. Sincerely, Shane Cohen PA-C  Otolaryngology Clinic Note Referring provider: Dr. Okey HPI:  Shane Blackburn is a 53 y.o. male kindly referred by Dr. Okey   Discussed the use of AI scribe software for clinical note transcription with the patient, who gave verbal consent to proceed.  History of Present Illness   Shane Blackburn is a 53 year old male who presents with dizziness and ear pressure. He was last seen in the office on 11/14/2023. Below is a recap of that encounter.    The patient is a 53 year old gentleman seen in our office for evaluation of dizziness.  The patient notes that approximately 4 months ago he was riding his motorcycle, he was wearing earplugs.  Since that time he notes he has had fullness in his right ear.  He feels as if there is something there as if he needs to pop his ears or remove some earwax.  He additionally notes that he hears some cracking and popping in his ear, with more prominent when yawning.  He notes he did have 1 episode where his ear popped and his symptoms completely resolved but they returned again.  He notes he has tried diphenhydramine with no significant improvement.  He notes he saw his primary care provider in May, they noted fluid within the ear, he was put on prednisone  taper, he notes that this did not improve his symptoms and action made him feel worse.  He notes that since that time he has had some episodes of dizziness, he feels like his depth perception is slightly off, he notes that movements of the head cause the sensation but he quickly regains and has complete resolution of symptoms.  He denies any associated neurologic symptoms with it.  He reports he has not had dizziness in the last 2 weeks.  He notes his hearing is normal, he  notes some baseline minimal tonal tinnitus.  He denies any worsening of the tinnitus with the episodes of dizziness.  He denies any history recurrent ear infections, no significant ear trauma or ear surgeries.  He notes the symptoms are most prominent in the early morning hours between 8 and 9 AM it lasts for a majority of the day and then improves at night again.  No history of seasonal allergies.   Update 01/18/2024  He experiences frequent bouts of dizziness, occurring more days than not, described as a sensation of swaying or unsteadiness even when sitting or standing still. These episodes typically begin in the morning between 8 and 10 AM and can persist throughout the day. The dizziness is often accompanied by significant anxiety, described as a 'fight or flight' response, which occurs only on days when the dizziness is present.  He has experienced ear symptoms, including frequent popping in the ears over the past 60 days, which has recently subsided. Two days ago, he experienced a sensation of fullness and pressure in the ear, which was painful and lasted for a couple of hours. Eating seemed to help alleviate the pressure. He has been using Flonase  and loratadine  for these symptoms but reports no relief from these medications.  His past medical history includes issues with blood pressure, particularly elevated readings in medical settings, with home readings typically in the low 130s/80s. He has tried various blood  pressure medications but experienced adverse reactions, making them intolerable. His baseline heart rate is usually between 50-60 bpm.  He maintains an active lifestyle, including motorcycling, running, and walking, and has a history of working in physically demanding environments without prior issues of dizziness or balance.  No numbness, tingling, weakness, or changes in hearing. The ringing in his ears, which was previously persistent, has almost completely subsided.         Independent Review of Additional Tests or Records:  Tympanometry 01/18/2024 bilateral type A typms   Audio 11/14/2023-      PMH/Meds/All/SocHx/FamHx/ROS:   Past Medical History:  Diagnosis Date   Benign essential HTN 03/03/2015   Depression    Dyslipidemia    GERD (gastroesophageal reflux disease)    Hypertension    Hyperthyroidism    now hypothyroidism   OSA (obstructive sleep apnea) 03/03/2015     Past Surgical History:  Procedure Laterality Date   FINGER SURGERY  2010   finger tip   WISDOM TOOTH EXTRACTION  1996    Family History  Problem Relation Age of Onset   Hypertension Mother    Diabetes Mother    Hypertension Father    CVA Father      Social Connections: Unknown (07/30/2021)   Received from [facility]   Social Network    Social Network: Not on file      Current Outpatient Medications:    fluticasone  (FLONASE ) 50 MCG/ACT nasal spray, Place 2 sprays into both nostrils daily., Disp: 16 g, Rfl: 6   levothyroxine (SYNTHROID, LEVOTHROID) 137 MCG tablet, Take 112 mcg by mouth daily before breakfast. , Disp: , Rfl:    loratadine  (CLARITIN ) 10 MG tablet, Take 1 tablet (10 mg total) by mouth daily., Disp: 30 tablet, Rfl: 11   lisinopril (PRINIVIL,ZESTRIL) 20 MG tablet, Take 20 mg by mouth daily., Disp: , Rfl:    Physical Exam:   BP (!) 157/94   Pulse 83   Temp 98 F (36.7 C)   SpO2 96%   Pertinent Findings  CN II-XII grossly intact- no nystagmus  Bilateral EAC clear and TM intact with well pneumatized middle ear spaces No obvious neck masses/lymphadenopathy/thyromegaly No respiratory distress or stridor      Seprately Identifiable Procedures:  None  Impression & Plans:  [name] is a 53 y.o. male with the following   Assessment and Plan    Eustachian tube dysfunction-  The patient has persistent right sided intermittent ear pressure, clicking and popping, his symptoms are consistent with eustachian tube dysfunction although previous  tympanometry was bilateral type a tympanometry, repeated again today with ongoing symptoms which resulted in bilateral type a tympanometry.  I do not feel that tympanostomy tube would be beneficial given the results.  I advised the patient could continue using the Flonase , nasal saline spray daily antihistamine and monitor for any changes in his symptoms.  Dizziness-  Unclear etiology at this time, could be related to potential eustachian tube dysfunction.  He has no clear symptoms of BPPV, Mnire's, vestibular neuritis, vestibular migraine, or any other inner ear pathology.  I did discuss vestibular rehab versus vestibular testing although they may be limited in their abilities to provide clear explanation to his ongoing symptoms.  Patient would like to think about his options, he will reach back out to the office with his wishes.  He may return at anytime for repeat evaluation.    - f/u PRN   Thank you for allowing me the opportunity to care  for your patient. Please do not hesitate to contact me should you have any other questions.  Sincerely, Shane Cohen PA-C [facility] Phone: [phone] Fax: [phone]  01/18/2024, 10:17 AM

## 2024-01-18 NOTE — Progress Notes (Signed)
  [address], [address] [place], [place] [address] [phone]  Audiological Evaluation    Name: Joel Cowin     DOB:   10/21/1970      MRN:   [id number]                                                                                     Service Date: 01/18/2024     Accompanied by: unaccompanied   Patient comes today after Reyes Cohen, PA-C sent a referral for a tympanogram due to concerns with right ear pressure (eustachian tube dysfunction concerns).    Tympanometry: Right ear: Normal external ear canal volume with normal middle ear pressure and tympanic membrane compliance (Type A). Findings are suggestive of normal middle ear function. Left ear: Normal external ear canal volume with normal middle ear pressure and tympanic membrane compliance (Type A). Findings are suggestive of normal middle ear function.   Recommendations: Follow up with ENT as scheduled for today. Return for a hearing evaluation if concerns with hearing changes arise or per MD recommendation.   Mishawn Hemann MARIE LEROUX-MARTINEZ, AUD

## 2024-01-19 DIAGNOSIS — E291 Testicular hypofunction: Secondary | ICD-10-CM | POA: Diagnosis not present

## 2024-01-19 DIAGNOSIS — E039 Hypothyroidism, unspecified: Secondary | ICD-10-CM | POA: Diagnosis not present

## 2024-01-19 DIAGNOSIS — I10 Essential (primary) hypertension: Secondary | ICD-10-CM | POA: Diagnosis not present

## 2024-01-19 DIAGNOSIS — R5383 Other fatigue: Secondary | ICD-10-CM | POA: Diagnosis not present
